# Patient Record
Sex: Male | Born: 1995 | Race: Black or African American | Hispanic: No | Marital: Single | Smoking: Current some day smoker
Health system: Southern US, Community
[De-identification: ages and names within clinical notes are randomized; demographics above are authoritative.]

## PROBLEM LIST (undated history)

## (undated) DIAGNOSIS — J45909 Unspecified asthma, uncomplicated: Secondary | ICD-10-CM

---

## 2015-09-24 ENCOUNTER — Emergency Department (HOSPITAL_COMMUNITY)
Admission: EM | Admit: 2015-09-24 | Discharge: 2015-09-24 | Disposition: A | Discharge status: Home / Self Care | Payer: Self-pay | Attending: Emergency Medicine | Admitting: Emergency Medicine

## 2015-09-24 ENCOUNTER — Encounter (HOSPITAL_COMMUNITY): Payer: Self-pay | Admitting: Emergency Medicine

## 2015-09-24 DIAGNOSIS — F1290 Cannabis use, unspecified, uncomplicated: Secondary | ICD-10-CM | POA: Insufficient documentation

## 2015-09-24 DIAGNOSIS — R55 Syncope and collapse: Secondary | ICD-10-CM | POA: Insufficient documentation

## 2015-09-24 LAB — MAGNESIUM: Magnesium: 1.8 mg/dL (ref 1.7–2.4)

## 2015-09-24 LAB — BASIC METABOLIC PANEL
ANION GAP: 5 (ref 5–15)
BUN: 19 mg/dL (ref 6–20)
CALCIUM: 9.4 mg/dL (ref 8.9–10.3)
CO2: 26 mmol/L (ref 22–32)
CREATININE: 1.23 mg/dL (ref 0.61–1.24)
Chloride: 107 mmol/L (ref 101–111)
GFR calc non Af Amer: >60 mL/min (ref >60)
Glucose, Bld: 99 mg/dL (ref 65–99)
POTASSIUM: 4 mmol/L (ref 3.5–5.1)
Sodium: 138 mmol/L (ref 135–145)

## 2015-09-24 LAB — CBC
HCT: 42.1 % (ref 39.0–52.0)
Hemoglobin: 14.3 g/dL (ref 13.0–17.0)
MCH: 29.9 pg (ref 26.0–34.0)
MCHC: 34 g/dL (ref 30.0–36.0)
MCV: 87.9 fL (ref 78.0–100.0)
PLATELETS: 236 10*3/uL (ref 150–400)
RBC: 4.79 MIL/uL (ref 4.22–5.81)
RDW: 13 % (ref 11.5–15.5)
WBC: 6.1 10*3/uL (ref 4.0–10.5)

## 2015-09-24 NOTE — ED Notes (Signed)
Writer went to check CBG, redo EKG and do othostatic vitals, but pt not in triage room.  Restroom and Xray checked to see if pt was there, she was not.  RN notified.

## 2015-09-24 NOTE — ED Triage Notes (Signed)
Patient A&O x4. Denies nausea, diaphoresis, CP, SOB. 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

## 2015-09-24 NOTE — ED Triage Notes (Signed)
Patient presents from work via ems for syncopal episode. Patient was at work emptying trash, became hot, became nauseated and diaphoretic and felt he was going to pass out. No syncopal episode.   Last VS: 118/83, 95hr, 17resp, 100% ra, cbg151, NSR on 12 lead in route.

## 2015-09-24 NOTE — ED Notes (Signed)
Dr. Rhunette CroftNanavati made aware patient appears to have left AMA.

## 2015-09-24 NOTE — ED Provider Notes (Signed)
[facility]-EMERGENCY DEPT Provider Note   CSN: [id number] Arrival date & time: 09/24/15  1506     History   Chief Complaint Chief Complaint  Patient presents with  . Near Syncope    HPI Noah Hanson is a 20 y.o. male.  HPI  Pt comes in with cc of near fainting. He has no medical hx. Uses marijuana occasionally. PT denies any family hx of premature deaths or cardiac disease. PT reports that he had a near syncope like episode prior to ER arrival. Pt was at work, and while he was working outside he started feeling hot, dizzy and felt like he might faint. Pt went to the cold room, and when he got out he had similar episode, so he came to the ER. PT reports that he had syncope - once in June and once in July. In June he was playing video game and had in July he was working outside.  History reviewed. No pertinent past medical history.  There are no active problems to display for this patient.   History reviewed. No pertinent surgical history.     Home Medications    Prior to Admission medications   Not on File    Family History No family history on file.  Social History Social History  Substance Use Topics  . Smoking status: Never Smoker  . Smokeless tobacco: Never Used  . Alcohol use Yes     Allergies   Review of patient's allergies indicates no known allergies.   Review of Systems Review of Systems  ROS 10 Systems reviewed and are negative for acute change except as noted in the HPI.   Physical Exam Updated Vital Signs BP 110/66 (BP Location: Right Arm)   Pulse 92   Temp 98.3 F (36.8 C) (Oral)   Resp 18   Ht 5\' 7"  (1.702 m)   Wt 130 lb (59 kg)   SpO2 99%   BMI 20.36 kg/m   Physical Exam  Constitutional: He is oriented to person, place, and time. He appears well-developed.  HENT:  Head: Normocephalic and atraumatic.  Eyes: Conjunctivae and EOM are normal. Pupils are equal, round, and reactive to light.  Neck: Normal range of motion. Neck  supple.  Cardiovascular: Normal rate, regular rhythm, normal heart sounds and intact distal pulses.   Pulmonary/Chest: Effort normal and breath sounds normal. No respiratory distress. He has no wheezes.  Abdominal: Soft. Bowel sounds are normal. He exhibits no distension. There is no tenderness. There is no rebound and no guarding.  Neurological: He is alert and oriented to person, place, and time.  Skin: Skin is warm.  Nursing note and vitals reviewed.    ED Treatments / Results  Labs (all labs ordered are listed, but only abnormal results are displayed) Labs Reviewed  BASIC METABOLIC PANEL  CBC  MAGNESIUM    EKG  EKG Interpretation  Date/Time:  Wednesday September 24 2015 15:20:34 EDT Ventricular Rate:  90 PR Interval:    QRS Duration: 71 QT Interval:  319 QTC Calculation: 391 R Axis:   87 Text Interpretation:  Sinus rhythm ST elevation, consider anterolateral injury St elevation inferior leads Likely J point elevation No old tracing to compare Confirmed by Rhunette CroftNANAVATI, MD, Janey GentaANKIT [phone]([id number]) on 09/24/2015 3:36:17 PM       Radiology No results found.  Procedures Procedures (including critical care time)  Medications Ordered in ED Medications - No data to display   Initial Impression / Assessment and Plan / ED Course  I have  reviewed the triage vital signs and the nursing notes.  Pertinent labs & imaging results that were available during my care of the patient were reviewed by me and considered in my medical decision making (see chart for details).  Clinical Course  Comment By Time  LATE ENTRY: EKG has diffuse ST elevation. No chest pain, dib. No signs of brugada. Intervals are normal. I spoke with Cardiology, Dr. [place]. He is aware of the presentation, prior syncope and no chest pain with the ekg. He clears the patient from Cards perspective - as he too thinks the EKG changes are likely early repo in a young male. Repeat ekg ordered. Pt informed of the discussion and we  ordered some basic labs. However, when the tech went for repeat labs, pt had left. His labs are reassuring. Derwood Kaplan, MD 08/30 2333      Final Clinical Impressions(s) / ED Diagnoses   Final diagnoses:  Near syncope   DDx includes:  Dysrhythmia PE Vasovagal/neurocardiogenic syncope Valvular disorder/Cardiomyopathy Anemia Pericarditis   New Prescriptions There are no discharge medications for this patient.    Derwood Kaplan, MD 09/24/15 [phone]

## 2017-09-22 ENCOUNTER — Emergency Department (HOSPITAL_COMMUNITY)
Admission: EM | Admit: 2017-09-22 | Discharge: 2017-09-22 | Disposition: A | Discharge status: Home / Self Care | Payer: Self-pay | Attending: Emergency Medicine | Admitting: Emergency Medicine

## 2017-09-22 ENCOUNTER — Emergency Department (HOSPITAL_COMMUNITY): Payer: Self-pay

## 2017-09-22 ENCOUNTER — Other Ambulatory Visit: Payer: Self-pay

## 2017-09-22 ENCOUNTER — Encounter (HOSPITAL_COMMUNITY): Payer: Self-pay

## 2017-09-22 DIAGNOSIS — Y999 Unspecified external cause status: Secondary | ICD-10-CM | POA: Insufficient documentation

## 2017-09-22 DIAGNOSIS — T148XXA Other injury of unspecified body region, initial encounter: Secondary | ICD-10-CM

## 2017-09-22 DIAGNOSIS — F1290 Cannabis use, unspecified, uncomplicated: Secondary | ICD-10-CM | POA: Insufficient documentation

## 2017-09-22 DIAGNOSIS — Z23 Encounter for immunization: Secondary | ICD-10-CM | POA: Insufficient documentation

## 2017-09-22 DIAGNOSIS — S91331A Puncture wound without foreign body, right foot, initial encounter: Secondary | ICD-10-CM | POA: Insufficient documentation

## 2017-09-22 DIAGNOSIS — Y939 Activity, unspecified: Secondary | ICD-10-CM | POA: Insufficient documentation

## 2017-09-22 DIAGNOSIS — Y929 Unspecified place or not applicable: Secondary | ICD-10-CM | POA: Insufficient documentation

## 2017-09-22 DIAGNOSIS — W458XXA Other foreign body or object entering through skin, initial encounter: Secondary | ICD-10-CM | POA: Insufficient documentation

## 2017-09-22 HISTORY — DX: Unspecified asthma, uncomplicated: J45.909

## 2017-09-22 MED ORDER — LIDOCAINE-EPINEPHRINE-TETRACAINE (LET) SOLUTION
3.0000 mL | Freq: Once | NASAL | Status: AC
  Administered 2017-09-22: 3 mL via TOPICAL
  Filled 2017-09-22: qty 3

## 2017-09-22 MED ORDER — TETANUS-DIPHTH-ACELL PERTUSSIS 5-2.5-18.5 LF-MCG/0.5 IM SUSP
0.5000 mL | Freq: Once | INTRAMUSCULAR | Status: AC
  Administered 2017-09-22: 0.5 mL via INTRAMUSCULAR
  Filled 2017-09-22: qty 0.5

## 2017-09-22 MED ORDER — BACITRACIN ZINC 500 UNIT/GM EX OINT
TOPICAL_OINTMENT | CUTANEOUS | Status: AC
  Administered 2017-09-22: 1
  Filled 2017-09-22: qty 2.7

## 2017-09-22 NOTE — ED Provider Notes (Signed)
[facility]-EMERGENCY DEPT Provider Note   CSN: [id number] Arrival date & time: 09/22/17  0030     History   Chief Complaint Chief Complaint  Patient presents with  . Foot Injury    HPI Noah Hanson is a 22 y.o. male.  Patient presents for wound on his right foot caused by stepping on a floor fan 2 days ago. The plastic fan broke and he is concerned there is a foreign body in his foot because his pain is still causing a problem with being ambulatory and working. No drainage. No significant swelling. He reports the wound still bleeds intermittently.  The history is provided by the patient. No language interpreter was used.    Past Medical History:  Diagnosis Date  . Asthma     There are no active problems to display for this patient.   History reviewed. No pertinent surgical history.      Home Medications    Prior to Admission medications   Not on File    Family History History reviewed. No pertinent family history.  Social History Social History   Tobacco Use  . Smoking status: Never Smoker  . Smokeless tobacco: Never Used  Substance Use Topics  . Alcohol use: Yes  . Drug use: Yes    Types: Marijuana     Allergies   Patient has no known allergies.   Review of Systems Review of Systems  Constitutional: Negative for fever.  Musculoskeletal:       See HPI.  Skin: Positive for wound.  Neurological: Negative for numbness.     Physical Exam Updated Vital Signs BP 128/83 (BP Location: Left Arm)   Pulse 89   Temp 97.9 F (36.6 C) (Oral)   Resp 16   Ht 5\' 8"  (1.727 m)   Wt 61.2 kg   SpO2 100%   BMI 20.53 kg/m   Physical Exam  Constitutional: He is oriented to person, place, and time. He appears well-developed and well-nourished.  Neck: Normal range of motion.  Pulmonary/Chest: Effort normal.  Musculoskeletal: Normal range of motion.  Right foot has a deep abrasion to plantar aspect at the arch at midfoot. There  is a full thickness puncture at the center of the abrasion. No FB visualized or palpated. No evidence secondary infection.   Neurological: He is alert and oriented to person, place, and time.  Skin: Skin is warm and dry.  Psychiatric: He has a normal mood and affect.     ED Treatments / Results  Labs (all labs ordered are listed, but only abnormal results are displayed) Labs Reviewed - No data to display  EKG None  Radiology No results found.  Procedures Procedures (including critical care time)  Medications Ordered in ED Medications  lidocaine-EPINEPHrine-tetracaine (LET) solution (has no administration in time range)  Tdap (BOOSTRIX) injection 0.5 mL (has no administration in time range)     Initial Impression / Assessment and Plan / ED Course  I have reviewed the triage vital signs and the nursing notes.  Pertinent labs & imaging results that were available during my care of the patient were reviewed by me and considered in my medical decision making (see chart for details).     Patient here after stepping on a plastic fan and being concerned for retained foreign body in the wound due to persistent pain.   Imaging is negative for FB. Tetanus updated. Wound explored and based of puncture is visualized well. No FB. Wound clean and bandaged with  Bacitracin.   Final Clinical Impressions(s) / ED Diagnoses   Final diagnoses:  None   1. Puncture wound right foot  ED Discharge Orders    None       Elpidio Anis, PA-C 09/22/17 0413    Gilda Crease, MD 09/22/17 [phone]

## 2017-09-22 NOTE — Discharge Instructions (Addendum)
Wash twice daily with warm soapy water and keep clean and covered. Apply topical antibiotic (over-the-counter Neopsporin or Polysporin).

## 2017-09-22 NOTE — ED Triage Notes (Signed)
Pt presents to ED from home for R foot injury. Pt reports that he stepped on a broken fan on Tuesday and thinks something is still in the foot.

## 2018-06-04 ENCOUNTER — Emergency Department (HOSPITAL_COMMUNITY): Payer: Self-pay

## 2018-06-04 ENCOUNTER — Other Ambulatory Visit: Payer: Self-pay

## 2018-06-04 ENCOUNTER — Emergency Department (HOSPITAL_COMMUNITY)
Admission: EM | Admit: 2018-06-04 | Discharge: 2018-06-04 | Disposition: A | Discharge status: Home / Self Care | Payer: Self-pay | Attending: Emergency Medicine | Admitting: Emergency Medicine

## 2018-06-04 ENCOUNTER — Encounter (HOSPITAL_COMMUNITY): Payer: Self-pay

## 2018-06-04 DIAGNOSIS — M25571 Pain in right ankle and joints of right foot: Secondary | ICD-10-CM

## 2018-06-04 DIAGNOSIS — S99921A Unspecified injury of right foot, initial encounter: Secondary | ICD-10-CM

## 2018-06-04 DIAGNOSIS — J45909 Unspecified asthma, uncomplicated: Secondary | ICD-10-CM | POA: Insufficient documentation

## 2018-06-04 NOTE — ED Triage Notes (Signed)
Pt states he has a hx of right ankle problems. Pt states that he is concerned he twisted it when playing basketball last night.

## 2018-06-04 NOTE — ED Provider Notes (Signed)
[facility]-EMERGENCY DEPT Provider Note   CSN: [id number] Arrival date & time: 06/04/18  1036    History   Chief Complaint Chief Complaint  Patient presents with  . Ankle Pain    right    HPI Noah Hanson is a 23 y.o. male.     HPI   Presents for evaluation of pain in right ankle after "rolling over on it," while playing basketball last night.  No other injuries.  He is using crutches and an ankle brace that he had at home already from a prior injury.  He does not take any medication for this.  He is currently employed.  He denies recent fever, chills, nausea, vomiting, cough, shortness of breath or chest pain.  There are no other known modifying factors.  Past Medical History:  Diagnosis Date  . Asthma     There are no active problems to display for this patient.   History reviewed. No pertinent surgical history.      Home Medications    Prior to Admission medications   Not on File    Family History No family history on file.  Social History Social History   Tobacco Use  . Smoking status: Never Smoker  . Smokeless tobacco: Never Used  Substance Use Topics  . Alcohol use: Yes  . Drug use: Yes    Types: Marijuana     Allergies   Patient has no known allergies.   Review of Systems Review of Systems  All other systems reviewed and are negative.    Physical Exam Updated Vital Signs BP 123/77 (BP Location: Left Arm)   Pulse 75   Temp 98 F (36.7 C) (Oral)   Resp 16   Ht 5\' 7"  (1.702 m)   Wt 59 kg   SpO2 98%   BMI 20.36 kg/m   Physical Exam Vitals signs and nursing note reviewed.  Constitutional:      General: He is not in acute distress.    Appearance: Normal appearance. He is well-developed. He is not ill-appearing, toxic-appearing or diaphoretic.  HENT:     Head: Normocephalic and atraumatic.     Right Ear: External ear normal.     Left Ear: External ear normal.  Eyes:     Conjunctiva/sclera:  Conjunctivae normal.     Pupils: Pupils are equal, round, and reactive to light.  Neck:     Musculoskeletal: Normal range of motion and neck supple.     Trachea: Phonation normal.  Cardiovascular:     Rate and Rhythm: Normal rate.  Pulmonary:     Effort: Pulmonary effort is normal.  Musculoskeletal:     Comments: He guards against movement of the right ankle secondary to pain.  The right ankle/foot is tender primarily over the lateral midfoot, without associated swelling or deformity.  There is no instability of the right ankle or foot.  Skin:    General: Skin is warm and dry.  Neurological:     Mental Status: He is alert and oriented to person, place, and time.     Cranial Nerves: No cranial nerve deficit.     Sensory: No sensory deficit.     Motor: No abnormal muscle tone.     Coordination: Coordination normal.  Psychiatric:        Mood and Affect: Mood normal.        Behavior: Behavior normal.        Thought Content: Thought content normal.  Judgment: Judgment normal.      ED Treatments / Results  Labs (all labs ordered are listed, but only abnormal results are displayed) Labs Reviewed - No data to display  EKG None  Radiology Dg Ankle Complete Right  Result Date: 06/04/2018 CLINICAL DATA:  Medial ankle pain, injury, difficulty bearing weight EXAM: RIGHT ANKLE - COMPLETE 3+ VIEW COMPARISON:  09/22/2017 FINDINGS: There is no evidence of fracture, dislocation, or joint effusion. There is no evidence of arthropathy or other focal bone abnormality. Soft tissues are unremarkable. IMPRESSION: No acute osseous finding or malalignment. Electronically Signed   By: Judie PetitM.  Shick M.D.   On: 06/04/2018 11:33    Procedures Procedures (including critical care time)  Medications Ordered in ED Medications - No data to display   Initial Impression / Assessment and Plan / ED Course  I have reviewed the triage vital signs and the nursing notes.  Pertinent labs & imaging results  that were available during my care of the patient were reviewed by me and considered in my medical decision making (see chart for details).         Patient Vitals for the past 24 hrs:  BP Temp Temp src Pulse Resp SpO2 Height Weight  06/04/18 1134 123/77 - - 75 16 98 % - -  06/04/18 1046 (!) 157/88 98 F (36.7 C) Oral 95 14 98 % - -  06/04/18 1045 - - - - - - 5\' 7"  (1.702 m) 59 kg    12:08 PM Reevaluation with update and discussion. After initial assessment and treatment, an updated evaluation reveals no change in clinical status.  Findings discussed with patient all questions were answered. Mancel BaleElliott Celia Friedland   Medical Decision Making: Right ankle injury, with pain both in ankle and foot.  No gross instability.  Possible fracture at the talonavicular bone.  This would be a small avulsion fracture, and not require acute intervention.  He is stable for outpatient management with follow-up with orthopedics, in a week or so.  CRITICAL CARE-no Performed by: Mancel BaleElliott Lam Bjorklund  Nursing Notes Reviewed/ Care Coordinated Applicable Imaging Reviewed Interpretation of Laboratory Data incorporated into ED treatment  The patient appears reasonably screened and/or stabilized for discharge and I doubt any other medical condition or other [facility] requiring further screening, evaluation, or treatment in the ED at this time prior to discharge.  Plan: Home Medications-ibuprofen PRN; Home Treatments-Rice; return here if the recommended treatment, does not improve the symptoms; Recommended follow up-Ortho 1 week   Final Clinical Impressions(s) / ED Diagnoses   Final diagnoses:  Acute right ankle pain  Injury of right foot, initial encounter    ED Discharge Orders    None       Mancel BaleWentz, Sanya Kobrin, MD 06/04/18 1210

## 2018-06-04 NOTE — Discharge Instructions (Addendum)
The x-ray indicated a possible small fracture to the talar navicular bone, of your foot.  This is very near to your ankle, and may or may not be an actual fracture.  This injury can be treated as a sprain, with ice, elevation, and ibuprofen.  Use the splint that you have, and crutches as needed for comfort when walking.  You can bear weight as tolerated.  Since there is a possible fracture it will be helpful to follow-up with an orthopedist, for further evaluation and treatment in a week or so.

## 2019-01-01 ENCOUNTER — Other Ambulatory Visit: Payer: Self-pay

## 2019-01-01 DIAGNOSIS — Z20822 Contact with and (suspected) exposure to covid-19: Secondary | ICD-10-CM

## 2019-01-03 ENCOUNTER — Telehealth: Payer: Self-pay | Admitting: Critical Care Medicine

## 2019-01-03 ENCOUNTER — Telehealth: Payer: Self-pay | Admitting: General Practice

## 2019-01-03 LAB — NOVEL CORONAVIRUS, NAA: SARS-CoV-2, NAA: DETECTED — AB

## 2019-01-03 NOTE — Telephone Encounter (Signed)
Pt called about +COVID test; he has loss of smell; tier of symptoms reviewed; instructions given to pt: " remain in self-quarantine until they meet the "Non-Test Criteria for Ending Self-Isolation". Non-Test Criteria for Ending Self-Isolation All persons with fever and respiratory symptoms should isolate themselves until ALL conditions listed below are met: " at least 10 days since symptoms onset " AND 3 consecutive days fever free without antipyretics (acetaminophen [Tylenol] or ibuprofen [Advil]) " AND improvement in respiratory symptoms " If the patient develops respiratory issues/distress, seek medical care in the Emergency Department, call 911, reports symptoms and report COVID-19 positive test. Patient Instructions " patient to continue to utilize over-the-counter medications for fever (ibuprofen and/or Tylenol) and cough (cough medicine and/or sore throat lozenges). " patient to wear a mask around people and follow good infection prevention techniques. " only leave home to seek medical care. " send family for food, prescriptions or medicines; or use delivery service.  " If the patient must leave the home, they MUST wear a mask in public. " limit contact with immediate family members or caregivers in the home, and use mask, social distancing, and handwashing to decrease risk to patients. o Please continue good preventive care measures, including frequent hand washing, avoid touching your face, cover coughs/sneezes with tissue or into elbow, stay out of crowds and keep a 6-foot distance from others.   " patient and family to clean hard surfaces touched by patient frequently with household cleaning products; pt advised to notify his PCP; pt also informed the [facility] Dept was notified; he verbalized understanding. Unable to chart in result note because no encounter created.

## 2019-01-03 NOTE — Telephone Encounter (Signed)
Pt called and received positive covid results and would like a call back. Pt states that he has a 13mo old and is very worried. Please advise

## 2019-01-03 NOTE — Telephone Encounter (Signed)
Attempted to call pt.  Voice mailbox full; no able to leave message at this time.

## 2019-01-03 NOTE — Telephone Encounter (Signed)
The patient is aware of his result has mild taste and smell sensation changes only he has been ill since 6 December.  I told him he needs to stay in isolation till 17 December.  He is not a monoclonal antibody candidate

## 2019-01-03 NOTE — Telephone Encounter (Signed)
-----   Message from Dimple Nanas, RN sent at 01/03/2019 10:10 AM EST ----- Regarding: Positive Results Positive Results ----- Message ----- From: Interface, [facility] Lab Results In Sent: 01/03/2019   6:36 AM EST To: Mobile Screening Testing Result Pool

## 2019-01-08 ENCOUNTER — Ambulatory Visit: Payer: Self-pay

## 2019-01-08 NOTE — Telephone Encounter (Signed)
Provided care advice advice , reviewed Isolation  Protocol  Voiced understanding.  Pt.  Requested a copy of results be mailed to him .  Will make request

## 2019-01-12 ENCOUNTER — Other Ambulatory Visit: Payer: Self-pay

## 2019-03-22 ENCOUNTER — Encounter (HOSPITAL_COMMUNITY): Payer: Self-pay | Admitting: Emergency Medicine

## 2019-03-22 ENCOUNTER — Emergency Department (HOSPITAL_COMMUNITY)
Admission: EM | Admit: 2019-03-22 | Discharge: 2019-03-22 | Disposition: A | Discharge status: Home / Self Care | Payer: Self-pay | Attending: Emergency Medicine | Admitting: Emergency Medicine

## 2019-03-22 ENCOUNTER — Other Ambulatory Visit: Payer: Self-pay

## 2019-03-22 DIAGNOSIS — L509 Urticaria, unspecified: Secondary | ICD-10-CM | POA: Insufficient documentation

## 2019-03-22 DIAGNOSIS — J45909 Unspecified asthma, uncomplicated: Secondary | ICD-10-CM | POA: Insufficient documentation

## 2019-03-22 MED ORDER — DIPHENHYDRAMINE HCL 25 MG PO TABS: 25.0000 mg | ORAL_TABLET | Freq: Four times a day (QID) | ORAL | 0 refills | Status: DC | PRN

## 2019-03-22 MED ORDER — DIPHENHYDRAMINE HCL 50 MG/ML IJ SOLN
50.0000 mg | Freq: Once | INTRAMUSCULAR | Status: AC
  Administered 2019-03-22: 20:00:00 50 mg via INTRAVENOUS
  Filled 2019-03-22: qty 1

## 2019-03-22 MED ORDER — METHYLPREDNISOLONE SODIUM SUCC 125 MG IJ SOLR
125.0000 mg | Freq: Once | INTRAMUSCULAR | Status: AC
  Administered 2019-03-22: 20:00:00 125 mg via INTRAVENOUS
  Filled 2019-03-22: qty 2

## 2019-03-22 MED ORDER — PREDNISONE 20 MG PO TABS: ORAL_TABLET | ORAL | 0 refills | Status: DC

## 2019-03-22 MED ORDER — SODIUM CHLORIDE 0.9 % IV BOLUS
1000.0000 mL | Freq: Once | INTRAVENOUS | Status: AC
  Administered 2019-03-22: 20:00:00 1000 mL via INTRAVENOUS

## 2019-03-22 MED ORDER — FAMOTIDINE IN NACL 20-0.9 MG/50ML-% IV SOLN
20.0000 mg | Freq: Once | INTRAVENOUS | Status: AC
  Administered 2019-03-22: 20 mg via INTRAVENOUS
  Filled 2019-03-22: qty 50

## 2019-03-22 NOTE — ED Notes (Signed)
Patient left prior to receiving prescriptions and discharge instructions. Patient last seen in NAD and ambulatory without difficulty.

## 2019-03-22 NOTE — ED Notes (Signed)
Patient not in room at time of attempted discharge.

## 2019-03-22 NOTE — ED Provider Notes (Signed)
[facility]-EMERGENCY DEPT Provider Note   CSN: [id number] Arrival date & time: 03/22/19  1836     History Chief Complaint  Patient presents with  . Urticaria    Noah Hanson is a 24 y.o. male.  The history is provided by the patient. No language interpreter was used.     24 year old male presenting complaining of itchy hives.  Patient developed itchy hives approximately 1 hour ago.  It started around his face and now spreading to his arms legs.  He denies any sensation of lightheadedness or dizziness no throat swelling tongue swelling trouble breathing wheezing abdominal cramping.  He is unaware of any inciting factor.  He admits to eating crab legs and corn for lunch but states that this not unusual for him as he ate seafood on a regular basis.  He also report playing basketball outside and may have been exposed to poison oak poison ivy but do not specifically recall any true exposure.  He denies any change in his soap, lotion, medication, or any other environmental changes.  He denies any prior allergic symptoms.  No specific treatment tried.  Symptoms moderate in severity.    Past Medical History:  Diagnosis Date  . Asthma     There are no problems to display for this patient.   History reviewed. No pertinent surgical history.     No family history on file.  Social History   Tobacco Use  . Smoking status: Never Smoker  . Smokeless tobacco: Never Used  Substance Use Topics  . Alcohol use: Yes  . Drug use: Yes    Types: Marijuana    Home Medications Prior to Admission medications   Not on File    Allergies    Patient has no known allergies.  Review of Systems   Review of Systems  All other systems reviewed and are negative.   Physical Exam Updated Vital Signs BP 108/90   Pulse (!) 110   Temp 98.2 F (36.8 C)   Resp 16   SpO2 99%   Physical Exam Vitals and nursing note reviewed.  Constitutional:      General: He is not  in acute distress.    Appearance: He is well-developed.  HENT:     Head: Atraumatic.     Mouth/Throat:     Comments: No angioedema no tongue swelling, no drooling, normal phonation. Eyes:     Conjunctiva/sclera: Conjunctivae normal.  Cardiovascular:     Rate and Rhythm: Tachycardia present.     Pulses: Normal pulses.     Heart sounds: Normal heart sounds.  Pulmonary:     Effort: Pulmonary effort is normal.     Breath sounds: Normal breath sounds. No wheezing.  Abdominal:     Palpations: Abdomen is soft.     Tenderness: There is no abdominal tenderness.  Musculoskeletal:     Cervical back: Neck supple.  Skin:    Findings: No rash (Urticaria noted throughout the face, bilateral arms, and bilateral thigh).  Neurological:     Mental Status: He is alert and oriented to person, place, and time.  Psychiatric:        Mood and Affect: Mood normal.     ED Results / Procedures / Treatments   Labs (all labs ordered are listed, but only abnormal results are displayed) Labs Reviewed - No data to display  EKG None  Radiology No results found.  Procedures Procedures (including critical care time)  Medications Ordered in ED Medications  methylPREDNISolone  sodium succinate (SOLU-MEDROL) 125 mg/2 mL injection 125 mg (125 mg Intravenous Given 03/22/19 1947)  diphenhydrAMINE (BENADRYL) injection 50 mg (50 mg Intravenous Given 03/22/19 1947)  famotidine (PEPCID) IVPB 20 mg premix (0 mg Intravenous Stopped 03/22/19 2101)  sodium chloride 0.9 % bolus 1,000 mL (0 mLs Intravenous Stopped 03/22/19 2115)    ED Course  I have reviewed the triage vital signs and the nursing notes.  Pertinent labs & imaging results that were available during my care of the patient were reviewed by me and considered in my medical decision making (see chart for details).    MDM Rules/Calculators/A&P                      BP 114/77   Pulse 65   Temp 98.2 F (36.8 C)   Resp 17   SpO2 100%   Final Clinical  Impression(s) / ED Diagnoses Final diagnoses:  Hives    Rx / DC Orders ED Discharge Orders    None     7:27 PM Patient here with urticarial rash started approximately 1 hour ago.  Unknown inciting factor.  At this time no obvious evidence of anaphylaxis however he is tachycardic and blood pressure is a bit on the softer side at 108/90.  Will give IV fluid, Solu-Medrol, Benadryl, Pepcid and will monitor closely.  10:05 PM Patient has been monitored for the past 3 hours with steady improvement of his symptoms.  At this time he felt comfortable going home.  Patient discharged home with prednisone and Benadryl.  Recommend outpatient follow-up with allergist for evaluation.  Return precaution discussed.   Domenic Moras, PA-C 03/22/19 2205    Lucrezia Starch, MD 03/23/19 [phone]

## 2019-03-22 NOTE — Discharge Instructions (Signed)
You have been evaluated for your hives.  Take medication prescribed.  Follow-up with an allergist for further evaluation of your condition.  Return to the ER if your condition worsen, if you have trouble breathing, tongue swelling, abdominal cramping, or if you have any other concern.

## 2019-03-22 NOTE — ED Triage Notes (Signed)
Pt reports that about 15 minutes or so ago after playing basketball started itching. Pt unsure what has caused it.

## 2019-05-02 ENCOUNTER — Ambulatory Visit: Payer: Medicaid Other | Attending: Internal Medicine

## 2019-05-02 DIAGNOSIS — Z20822 Contact with and (suspected) exposure to covid-19: Secondary | ICD-10-CM

## 2019-05-03 ENCOUNTER — Telehealth: Payer: Self-pay | Admitting: General Practice

## 2019-05-03 LAB — SARS-COV-2, NAA 2 DAY TAT

## 2019-05-03 LAB — NOVEL CORONAVIRUS, NAA: SARS-CoV-2, NAA: NOT DETECTED

## 2019-05-03 NOTE — Telephone Encounter (Signed)
Negative COVID results given. Patient results "NOT Detected." Caller expressed understanding. ° °

## 2019-06-12 ENCOUNTER — Other Ambulatory Visit: Payer: Self-pay

## 2019-06-12 ENCOUNTER — Emergency Department (HOSPITAL_COMMUNITY)
Admission: EM | Admit: 2019-06-12 | Discharge: 2019-06-12 | Disposition: A | Discharge status: Home / Self Care | Payer: Medicaid Other | Attending: Emergency Medicine | Admitting: Emergency Medicine

## 2019-06-12 ENCOUNTER — Encounter (HOSPITAL_COMMUNITY): Payer: Self-pay

## 2019-06-12 DIAGNOSIS — R1110 Vomiting, unspecified: Secondary | ICD-10-CM | POA: Insufficient documentation

## 2019-06-12 DIAGNOSIS — Z5321 Procedure and treatment not carried out due to patient leaving prior to being seen by health care provider: Secondary | ICD-10-CM | POA: Insufficient documentation

## 2019-06-12 DIAGNOSIS — R109 Unspecified abdominal pain: Secondary | ICD-10-CM | POA: Insufficient documentation

## 2019-06-12 MED ORDER — SODIUM CHLORIDE 0.9% FLUSH: 3.0000 mL | Freq: Once | INTRAVENOUS | Status: DC

## 2019-06-12 NOTE — ED Triage Notes (Signed)
patient c/o abdominal pain that feels like gnawing and emesis since 1200 today.

## 2019-06-12 NOTE — ED Notes (Signed)
Pt called for rooming x2. No answer 

## 2019-06-12 NOTE — ED Notes (Signed)
Pt called for rooming x3! No answer! 

## 2019-06-12 NOTE — ED Notes (Signed)
Pt called for rooming x1 with no answer.

## 2019-08-01 ENCOUNTER — Ambulatory Visit: Payer: Medicaid Other | Attending: Internal Medicine

## 2019-08-01 DIAGNOSIS — Z20822 Contact with and (suspected) exposure to covid-19: Secondary | ICD-10-CM

## 2019-08-02 LAB — NOVEL CORONAVIRUS, NAA: SARS-CoV-2, NAA: NOT DETECTED

## 2019-08-02 LAB — SARS-COV-2, NAA 2 DAY TAT

## 2020-01-03 ENCOUNTER — Encounter (HOSPITAL_COMMUNITY): Payer: Self-pay

## 2020-01-03 ENCOUNTER — Other Ambulatory Visit: Payer: Medicaid Other

## 2020-01-03 DIAGNOSIS — Z20822 Contact with and (suspected) exposure to covid-19: Secondary | ICD-10-CM

## 2020-01-04 LAB — SARS-COV-2, NAA 2 DAY TAT

## 2020-01-04 LAB — NOVEL CORONAVIRUS, NAA: SARS-CoV-2, NAA: NOT DETECTED

## 2020-02-06 ENCOUNTER — Other Ambulatory Visit: Payer: Medicaid Other

## 2020-02-08 ENCOUNTER — Other Ambulatory Visit: Payer: Medicaid Other

## 2020-02-16 ENCOUNTER — Ambulatory Visit (HOSPITAL_COMMUNITY)
Admission: EM | Admit: 2020-02-16 | Discharge: 2020-02-16 | Disposition: A | Discharge status: Home / Self Care | Payer: No Payment, Other | Attending: Psychiatry | Admitting: Psychiatry

## 2020-02-16 ENCOUNTER — Ambulatory Visit (HOSPITAL_COMMUNITY)
Admission: RE | Admit: 2020-02-16 | Discharge: 2020-02-16 | Disposition: A | Discharge status: Home / Self Care | Payer: Federal, State, Local not specified - Other | Attending: Psychiatry | Admitting: Psychiatry

## 2020-02-16 ENCOUNTER — Other Ambulatory Visit: Payer: Self-pay

## 2020-02-16 DIAGNOSIS — F329 Major depressive disorder, single episode, unspecified: Secondary | ICD-10-CM | POA: Diagnosis not present

## 2020-02-16 DIAGNOSIS — R45851 Suicidal ideations: Secondary | ICD-10-CM | POA: Insufficient documentation

## 2020-02-16 DIAGNOSIS — F1220 Cannabis dependence, uncomplicated: Secondary | ICD-10-CM | POA: Insufficient documentation

## 2020-02-16 DIAGNOSIS — Z20822 Contact with and (suspected) exposure to covid-19: Secondary | ICD-10-CM | POA: Insufficient documentation

## 2020-02-16 DIAGNOSIS — F419 Anxiety disorder, unspecified: Secondary | ICD-10-CM | POA: Insufficient documentation

## 2020-02-16 DIAGNOSIS — Z79899 Other long term (current) drug therapy: Secondary | ICD-10-CM | POA: Insufficient documentation

## 2020-02-16 DIAGNOSIS — F332 Major depressive disorder, recurrent severe without psychotic features: Secondary | ICD-10-CM | POA: Insufficient documentation

## 2020-02-16 DIAGNOSIS — Z7289 Other problems related to lifestyle: Secondary | ICD-10-CM | POA: Insufficient documentation

## 2020-02-16 DIAGNOSIS — F321 Major depressive disorder, single episode, moderate: Secondary | ICD-10-CM

## 2020-02-16 LAB — RESP PANEL BY RT-PCR (FLU A&B, COVID) ARPGX2
Influenza A by PCR: NEGATIVE
Influenza B by PCR: NEGATIVE
SARS Coronavirus 2 by RT PCR: NEGATIVE

## 2020-02-16 MED ORDER — HYDROXYZINE HCL 25 MG PO TABS: 25.0000 mg | ORAL_TABLET | Freq: Three times a day (TID) | ORAL | 0 refills | Status: DC | PRN

## 2020-02-16 MED ORDER — ALUM & MAG HYDROXIDE-SIMETH 200-200-20 MG/5ML PO SUSP: 30.0000 mL | ORAL | Status: DC | PRN

## 2020-02-16 MED ORDER — HYDROXYZINE HCL 25 MG PO TABS
25.0000 mg | ORAL_TABLET | ORAL | Status: DC | PRN
  Administered 2020-02-16: 25 mg via ORAL
  Filled 2020-02-16: qty 1

## 2020-02-16 MED ORDER — MAGNESIUM HYDROXIDE 400 MG/5ML PO SUSP: 30.0000 mL | Freq: Every day | ORAL | Status: DC | PRN

## 2020-02-16 MED ORDER — ACETAMINOPHEN 325 MG PO TABS: 650.0000 mg | ORAL_TABLET | Freq: Four times a day (QID) | ORAL | Status: DC | PRN

## 2020-02-16 NOTE — ED Notes (Signed)
Pt sleeping@this  tim. No c/o of pain or distress. Breathing even and unlabored. Will continue to monitor for safety

## 2020-02-16 NOTE — ED Notes (Signed)
Pt is requesting to leave the facility he said "I just cannot stay here". NP was notified of patient wanting to leave

## 2020-02-16 NOTE — ED Notes (Signed)
Pt resting in chair/bed watching television. No distress noted. Pt remains safe on unit.

## 2020-02-16 NOTE — H&P (Signed)
Behavioral Health Medical Screening Exam  Noah Hanson is a 25 y.o. male.  Who [facility] a [facility]-in.  Patient reports that he got into an argument with his girlfriend, felt overwhelmed, took a gun but stopped from doing anything as he has a daughter and wanted to be there for her.  Patient reports that he gave the gun to his girlfriend.  Patient reports that his girlfriend just dropped him off, did not stay, feels she does not care for him.  Patient that he is struggling on and off for depression, feeling overwhelmed, is not sure he can keep himself safe and feels he needs psychiatric help.  Patient reports that he does have social support which is his aunt and his mom and reports that his mom lives with his aunt.  He states that they live in [place] but as he is overwhelmed, at this point cannot contract for safety.  Patient states that he would do fine in the hospital, wants to be there for his daughter.  Total Time spent with patient: 30 minutes  Psychiatric Specialty Exam Psychiatric Specialty Exam: Physical Exam Constitutional:      Appearance: Normal appearance.  HENT:     Head: Normocephalic and atraumatic.     Mouth/Throat:     Mouth: Mucous membranes are moist.  Eyes:     Extraocular Movements: Extraocular movements intact.     Conjunctiva/sclera: Conjunctivae normal.     Pupils: Pupils are equal, round, and reactive to light.  Cardiovascular:     Rate and Rhythm: Normal rate and regular rhythm.     Pulses: Normal pulses.  Pulmonary:     Effort: Pulmonary effort is normal.     Breath sounds: Normal breath sounds.  Musculoskeletal:        General: Normal range of motion.     Cervical back: Normal range of motion and neck supple.  Skin:    General: Skin is warm and dry.  Neurological:     General: No focal deficit present.     Mental Status: He is alert and oriented to person, place, and time.     Review of Systems  Constitutional:  Negative for fever and malaise/fatigue.  HENT: Negative.  Negative for congestion, hearing loss and sore throat.   Eyes: Positive for redness. Negative for blurred vision.       Patient is tearful  Cardiovascular: Negative for chest pain and palpitations.  Gastrointestinal: Negative.  Negative for diarrhea, heartburn, nausea and vomiting.  Musculoskeletal: Negative for joint pain and myalgias.  Neurological: Negative for dizziness, tingling, seizures and weakness. Loss of consciousness: .mse.  Psychiatric/Behavioral: Positive for depression, substance abuse and suicidal ideas (.pse). The patient is not nervous/anxious and does not have insomnia.     Blood pressure 135/88, pulse 79, temperature 98.6 F (37 C), temperature source Oral, resp. rate 20, SpO2 100 %.There is no height or weight on file to calculate BMI.  General Appearance: Casual and tearful  Eye Contact:  Fair  Speech:  Clear and Coherent and Normal Rate  Volume:  Decreased  Mood:  Depressed, Dysphoric, Hopeless and Worthless  Affect:  Appropriate, Constricted and Tearful  Thought Process:  Coherent, Goal Directed and Descriptions of Associations: Intact  Orientation:  Full (Time, Place, and Person)  Thought Content:  Illogical and Rumination  Suicidal Thoughts:  Yes.  with intent/plan  Homicidal Thoughts:  No  Memory:  Immediate;   Fair Recent;   Fair Remote;   Fair  Judgement:  Impaired  Insight:  Shallow  Psychomotor Activity:  Normal  Concentration:  Concentration: Fair and Attention Span: Fair  Recall:  [employer] of Knowledge:  Fair  Language:  Fair  Akathisia:  No  Handed:  Right  AIMS (if indicated):     Assets:  Desire for Improvement Physical Health Social Support  ADL's:  Impaired  Cognition:  WNL  Sleep:       Physical Exam: Physical Exam Constitutional:      Appearance: Normal appearance.  HENT:     Head: Normocephalic and atraumatic.     Mouth/Throat:     Mouth: Mucous membranes are moist.   Eyes:     Extraocular Movements: Extraocular movements intact.     Conjunctiva/sclera: Conjunctivae normal.     Pupils: Pupils are equal, round, and reactive to light.  Cardiovascular:     Rate and Rhythm: Normal rate and regular rhythm.     Pulses: Normal pulses.  Pulmonary:     Effort: Pulmonary effort is normal.     Breath sounds: Normal breath sounds.  Musculoskeletal:        General: Normal range of motion.     Cervical back: Normal range of motion and neck supple.  Skin:    General: Skin is warm and dry.  Neurological:     General: No focal deficit present.     Mental Status: He is alert and oriented to person, place, and time.    Review of Systems  Constitutional: Negative for fever and malaise/fatigue.  HENT: Negative.  Negative for congestion, hearing loss and sore throat.   Eyes: Positive for redness. Negative for blurred vision.       Patient is tearful  Cardiovascular: Negative for chest pain and palpitations.  Gastrointestinal: Negative.  Negative for diarrhea, heartburn, nausea and vomiting.  Musculoskeletal: Negative for joint pain and myalgias.  Neurological: Negative for dizziness, tingling, seizures and weakness. Loss of consciousness: .mse.  Psychiatric/Behavioral: Positive for depression, substance abuse and suicidal ideas (.pse). The patient is not nervous/anxious and does not have insomnia.    There were no vitals taken for this visit. There is no height or weight on file to calculate BMI.  Musculoskeletal: Strength & Muscle Tone: within normal limits Gait & Station: normal Patient leans: N/A   Recommendations: Patient to be admitted for continuous assessment at [facility] behavioral health urgent care for crisis stabilization along with medication management. Based on my evaluation the patient does not appear to have an emergency medical condition.  Nelly Rout, MD 02/16/2020, 12:52 PM
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
## 2020-02-16 NOTE — ED Provider Notes (Signed)
Behavioral Health Admission H&P [facility] - Behavioral Health Services & OBS)  Date: 02/16/20 Patient Name: Noah Hanson MRN: [id number] Chief Complaint:  Chief Complaint  Patient presents with  . Suicidal      Diagnoses:  Final diagnoses:  None    Evaluation:  Noah Hanson was transferred from [facility] behavioral health patient is requesting to discharge.  Reported things just became overwhelming.  States he resides with his girlfriend and his girlfriend sister.  He reports he feels as if " I do everything for their family with no help."  Currently he is denying suicidal or homicidal ideations.  Reports girlfriend has access to the firearm.  Patient encouraged to continue with overnight observation will initiate hydroxyzine for anxiety/sedation.  Patient was receptive.  Staff to continue to monitor for safety.  Support,encouragement and  reassurance was provided.   HPI: Per admission assessment note: Noah Hanson is a 25 y.o. male.  Who presented to behavioral health Hospital as a walk-in.  Patient reports that he got into an argument with his girlfriend, felt overwhelmed, took a gun but stopped from doing anything as he has a daughter and wanted to be there for her.  Patient reports that he gave the gun to his girlfriend.  Patient reports that his girlfriend just dropped him off, did not stay, feels she does not care for him.  Patient that he is struggling on and off for depression, feeling overwhelmed, is not sure he can keep himself safe and feels he needs psychiatric help.  Patient reports that he does have social support which is his aunt and his mom and reports that his mom lives with his aunt.  He states that they live in [place] but as he is overwhelmed, at this point cannot contract for safety.  Patient states that he would do fine in the hospital, wants to be there for his daughter.  PHQ 2-9:  Flowsheet Row ED from 02/16/2020 in [facility]  Thoughts that you would be better off  dead, or of hurting yourself in some way More than half the days  [Phreesia 02/16/2020]  PHQ-9 Total Score 20      Flowsheet Row ED from 02/16/2020 in [facility]  C-SSRS RISK CATEGORY High Risk       Total Time spent with patient: 15 minutes  Musculoskeletal  Strength & Muscle Tone: within normal limits Gait & Station: normal Patient leans: N/A  Psychiatric Specialty Exam  Presentation General Appearance: Appropriate for Environment  Eye Contact:None  Speech:Clear and Coherent  Speech Volume:Normal  Handedness:Right   Mood and Affect  Mood:Anxious; Depressed  Affect:Congruent   Thought Process  Thought Processes:Coherent  Descriptions of Associations:Intact  Orientation:Full (Time, Place and Person)  Thought Content:Logical  Hallucinations:Hallucinations: None  Ideas of Reference:None  Suicidal Thoughts:Suicidal Thoughts: No  Homicidal Thoughts:Homicidal Thoughts: No   Sensorium  Memory:No data recorded Judgment:Fair  Insight:Fair   Executive Functions  Concentration:Fair  Attention Span:Fair  Recall:Good  Fund of Knowledge:Good  Language:Good   Psychomotor Activity  Psychomotor Activity:Psychomotor Activity: Normal   Assets  Assets:Communication Skills   Sleep  Sleep:Sleep: Fair   Physical Exam ROS  Blood pressure 120/80, pulse 73, temperature 98 F (36.7 C), temperature source Oral, resp. rate 18, height 5\' 8"  (1.727 m), weight 130 lb (59 kg), SpO2 100 %. Body mass index is 19.77 kg/m.  Past Psychiatric History: Denied  Is the patient at risk to self? Yes  Has the patient been a risk to self in the past  6 months? Yes .    Has the patient been a risk to self within the distant past? No   Is the patient a risk to others? No   Has the patient been a risk to others in the past 6 months? No   Has the patient been a risk to others within the distant past? No   Past Medical History:  Past  Medical History:  Diagnosis Date  . Asthma    No past surgical history on file.  Family History:  Family History  Problem Relation Age of Onset  . Healthy Mother     Social History:  Social History   Socioeconomic History  . Marital status: Single    Spouse name: Not on file  . Number of children: Not on file  . Years of education: Not on file  . Highest education level: Not on file  Occupational History  . Not on file  Tobacco Use  . Smoking status: Never Smoker  . Smokeless tobacco: Never Used  Vaping Use  . Vaping Use: Never used  Substance and Sexual Activity  . Alcohol use: Yes  . Drug use: Yes    Types: Marijuana  . Sexual activity: Not on file  Other Topics Concern  . Not on file  Social History Narrative   ** Merged History Encounter **       Social Determinants of Health   Financial Resource Strain: Not on file  Food Insecurity: Not on file  Transportation Needs: Not on file  Physical Activity: Not on file  Stress: Not on file  Social Connections: Not on file  Intimate Partner Violence: Not on file    SDOH:  SDOH Screenings   Alcohol Screen: Not on file  Depression (PHQ2-9): Medium Risk  . PHQ-2 Score: 20  Financial Resource Strain: Not on file  Food Insecurity: Not on file  Housing: Not on file  Physical Activity: Not on file  Social Connections: Not on file  Stress: Not on file  Tobacco Use: Low Risk   . Smoking Tobacco Use: Never Smoker  . Smokeless Tobacco Use: Never Used  Transportation Needs: Not on file    Last Labs:  Lab on 01/03/2020  Component Date Value Ref Range Status  . SARS-CoV-2, NAA 01/03/2020 Not Detected  Not Detected Final   Comment: This nucleic acid amplification test was developed and its performance characteristics determined by [employer]. Nucleic acid amplification tests include RT-PCR and TMA. This test has not been FDA cleared or approved. This test has been authorized by FDA under an Emergency  Use Authorization (EUA). This test is only authorized for the duration of time the declaration that circumstances exist justifying the authorization of the emergency use of in vitro diagnostic tests for detection of SARS-CoV-2 virus and/or diagnosis of COVID-19 infection under section 564(b)(1) of the Act, 21 U.S.C. 701XBL-3(J) (1), unless the authorization is terminated or revoked sooner. When diagnostic testing is negative, the possibility of a false negative result should be considered in the context of a patient's recent exposures and the presence of clinical signs and symptoms consistent with COVID-19. An individual without symptoms of COVID-19 and who is not shedding SARS-CoV-2 virus wo                          uld expect to have a negative (not detected) result in this assay.   Marland Kitchen SARS-CoV-2, NAA 2 DAY TAT 01/03/2020 Performed   Final  Allergies: Patient has no known allergies.  PTA Medications: (Not in a hospital admission)   Medical Decision Making  Overnight observation Initiated hydroxyzine 25 mg p.o. 3 times daily as needed -Declined antidepressant    Recommendations  Based on my evaluation the patient does not appear to have an emergency medical condition.  Oneta Rack, NP 02/16/20  3:42 PM

## 2020-02-16 NOTE — ED Notes (Signed)
Pt signed AMA for d/c

## 2020-02-16 NOTE — ED Notes (Signed)
25 yo male direct admit from [facility] due to suicidal gesture by walking down road with gun in his hand. Pt states, "my girlfriend and I were arguing and some things were said. So I just grabbed a gun and started walking down the road. I thought about shooting myself but I didn't. My girlfriend has the gun now. Sometimes I see shadows of dogs and things. I think I am bipolar". At current, pt denies SI/HI/AVH. Pt oriented to unit. Safety maintained.

## 2020-02-16 NOTE — ED Provider Notes (Signed)
FBC/OBS ASAP Discharge Summary  Date and Time: 02/16/2020 11:08 PM  Name: Noah Hanson  MRN:  [id number]   Discharge Diagnoses:  Final diagnoses:  Severe episode of recurrent major depressive disorder, without psychotic features (HCC)  Cannabis use disorder, moderate, dependence (HCC)    Subjective: Was notified by nursing staff that patient was requesting to leave and be discharged from the [facility].  Spoke with the patient regarding this.  Patient states that being in the [facility] observation area with nothing to do or no one to talk to is making him very angry.  Patient states that continuing to be at the behavioral health urgent care is not helping him and that he does not believe that he needs to be at the behavioral urgent care anymore at this point.  Patient denies SI, HI, AVH, paranoia, or delusions.  Patient states that he "loves life" and states that he could never think about or follow through with harming or killing himself because he needs to live for his girlfriend and 26-year-old child.  Patient states that his girlfriend sold her gun on 02/16/20 after the patient went to [facility] on 02/16/20 and reports that him and his girlfriend no longer have access to any guns or weapons.  He states that his appetite is good.  He reports that he is wanting to go home to be with his girlfriend and 7-year-old child.  Patient states that he would like to be see a psychiatrist and therapist as an outpatient.  Patient also states that he believes the Vistaril is helping with his anxiety and would like to continue the Vistaril.  With the patient's consent, collateral was obtained by speaking with the patient's girlfriend Dennison Nancy: [phone]) via phone.  Ms. Dimas Aguas reports that early on February 16, 2020, the patient "seeing fine".  She states that the patient told her on February 16, 2020 that he was going for a walk and she noticed that her gun was missing.  Ms. Dimas Aguas states that she called the patient and that  the patient was tearful and told her to pick him up.  Ms. Dimas Aguas says that she then brought the patient to [facility] to be evaluated.  Ms. Dimas Aguas confirms that after taking the patient to [facility] on 02/16/20, she sold her gun on 02/16/20 and states that her and the patient no longer have any guns/weapons in their home and no longer have access to any guns or weapons.  Ms. Dimas Aguas denies the patient making any threats of wanting to kill himself.  Ms. Dimas Aguas denies the patient ever exhibiting HI in the past.  Ms. Dimas Aguas states that she lives with the patient and their 62-year-old child in [place].  She can contract for patient's safety and states that she feels safe having the patient back at home with her and their child.   After speaking with the patient and obtaining collateral from the patient's girlfriend, I notified the patient that my recommendation was still for the patient to remain in [facility] continuous observation/assessment for safety reasons and further stabilization and treatment, with the patient being reevaluated on the morning of February 17, 2020 and future disposition to be determined at that time.  Despite my recommendation, patient remained adamant that he wanted to be discharged and did not want to be at the [facility] anymore. Due to patient denying SI, HI, not appearing psychotic, and confirming that the patient had no access to weapons at this time, patient does not meet IVC criteria at this time.  After discussing options with the patient, patient decided to leave the behavioral health urgent care AMA.  AMA form completed by patient prior to discharge.   Stay Summary: Per Chart Review: [facility] and was assessed by Dr. Lucianne MussKumar at that time. [facility], patient was reporting that he was feeling overwhelmed and struggling with intermittent depression.  Patient stated at that time that he had taken a gun but decided not to do anything with the gun and  wanted to get help. [facility], Dr. Lucianne MussKumar recommended patient to be admitted to the behavioral health urgent care for continuous assessment for crisis stabilization and medication management.  Patient was then assessed by Hillery Jacksanika Lewis, NP at the behavioral health urgent care on February 16, 2020.  Patient was requesting discharge from the behavioral health urgent care upon assessment by Hillery Jacksanika Lewis, NP.  Patient denied SI or HI at that time and was encouraged to continue behavioral health urgent care overnight observation/assessment with initiation of Vistaril.  Patient agreed to this plan at that time and was admitted to the behavior health urgent care continuous observation and started on Vistaril at that time.   Later on in the evening of February 16, 2020, patient requested to be discharged from the behavioral health urgent care.  After speaking with the patient and obtaining collateral from the patient's girlfriend, I notified the patient that my recommendation was still for the patient to [facility]/assessment for safety reasons and further stabilization and treatment, with the patient being reevaluated on the morning of February 17, 2020 and future disposition to be determined at that time. Due to patient denying SI, HI, not appearing psychotic, and confirming that the patient had no access to weapons at this time, patient does not meet IVC criteria at this time.  After discussing options with the patient, patient decided to leave the behavioral health urgent care AMA.  AMA form completed by patient prior to discharge.   Total Time spent with patient: 30 minutes  Past Psychiatric History: None (Per Patient)  Past Medical History:  Past Medical History:  Diagnosis Date  . Asthma    No past surgical history on file. Family History:  Family History  Problem Relation Age of Onset  . Healthy Mother    Family Psychiatric History: None (Per Patient)  Social History:   Social History   Substance and Sexual Activity  Alcohol Use Yes     Social History   Substance and Sexual Activity  Drug Use Yes  . Types: Marijuana    Social History   Socioeconomic History  . Marital status: Single    Spouse name: Not on file  . Number of children: Not on file  . Years of education: Not on file  . Highest education level: Not on file  Occupational History  . Not on file  Tobacco Use  . Smoking status: Never Smoker  . Smokeless tobacco: Never Used  Vaping Use  . Vaping Use: Never used  Substance and Sexual Activity  . Alcohol use: Yes  . Drug use: Yes    Types: Marijuana  . Sexual activity: Not on file  Other Topics Concern  . Not on file  Social History Narrative   ** Merged History Encounter **       Social Determinants of Health   Financial Resource Strain: Not on file  Food Insecurity: Not on file  Transportation Needs: Not on  file  Physical Activity: Not on file  Stress: Not on file  Social Connections: Not on file   SDOH:  SDOH Screenings   Alcohol Screen: Not on file  Depression (PHQ2-9): Medium Risk  . PHQ-2 Score: 20  Financial Resource Strain: Not on file  Food Insecurity: Not on file  Housing: Not on file  Physical Activity: Not on file  Social Connections: Not on file  Stress: Not on file  Tobacco Use: Low Risk   . Smoking Tobacco Use: Never Smoker  . Smokeless Tobacco Use: Never Used  Transportation Needs: Not on file    Has this patient used any form of tobacco in the last 30 days? (Cigarettes, Smokeless Tobacco, Cigars, and/or Pipes) Prescription not provided because: patient does not use tobacco products  Current Medications:  Current Facility-Administered Medications  Medication Dose Route Frequency Provider Last Rate Last Admin  . acetaminophen (TYLENOL) tablet 650 mg  650 mg Oral Q6H PRN Leevy-Johnson, Brooke A, NP      . alum & mag hydroxide-simeth (MAALOX/MYLANTA) 200-200-20 MG/5ML suspension 30 mL  30 mL  Oral Q4H PRN Leevy-Johnson, Brooke A, NP      . hydrOXYzine (ATARAX/VISTARIL) tablet 25 mg  25 mg Oral Q4H PRN Oneta Rack, NP   25 mg at 02/16/20 1804  . magnesium hydroxide (MILK OF MAGNESIA) suspension 30 mL  30 mL Oral Daily PRN Leevy-Johnson, Brooke A, NP       Current Outpatient Medications  Medication Sig Dispense Refill  . [START ON 02/17/2020] hydrOXYzine (ATARAX/VISTARIL) 25 MG tablet Take 1 tablet (25 mg total) by mouth 3 (three) times daily as needed for anxiety. 60 tablet 0    PTA Medications: (Not in a hospital admission)   Musculoskeletal  Strength & Muscle Tone: within normal limits Gait & Station: normal Patient leans: N/A  Psychiatric Specialty Exam  Presentation  General Appearance: Appropriate for Environment; Casual; Well Groomed  Eye Contact:Good  Speech:Clear and Coherent; Normal Rate  Speech Volume:Normal  Handedness:Right   Mood and Affect  Mood:Euthymic  Affect:Congruent   Thought Process  Thought Processes:Coherent; Goal Directed; Linear  Descriptions of Associations:Intact  Orientation:Full (Time, Place and Person)  Thought Content:Logical; WDL  Hallucinations:Hallucinations: None  Ideas of Reference:None  Suicidal Thoughts:Suicidal Thoughts: No  Homicidal Thoughts:Homicidal Thoughts: No   Sensorium  Memory:Immediate Good; Recent Good; Remote Good  Judgment:Fair  Insight:Fair   Executive Functions  Concentration:Fair  Attention Span:Fair  Recall:Good  Fund of Knowledge:Good  Language:Good   Psychomotor Activity  Psychomotor Activity:Psychomotor Activity: Normal   Assets  Assets:Communication Skills; Desire for Improvement; Housing; Leisure Time; Physical Health; Resilience; Social Support; Transportation   Sleep  Sleep:Sleep: Fair   Physical Exam  Physical Exam Vitals reviewed.  Constitutional:      General: He is not in acute distress.    Appearance: He is not ill-appearing, toxic-appearing or  diaphoretic.  HENT:     Head: Normocephalic and atraumatic.     Right Ear: External ear normal.     Left Ear: External ear normal.  Cardiovascular:     Rate and Rhythm: Normal rate.  Pulmonary:     Effort: Pulmonary effort is normal. No respiratory distress.  Musculoskeletal:        General: Normal range of motion.     Cervical back: Normal range of motion.  Neurological:     Mental Status: He is alert and oriented to person, place, and time.  Psychiatric:        Attention and Perception: Attention  and perception normal. He does not perceive auditory or visual hallucinations.        Speech: Speech normal.        Behavior: Behavior is not slowed, aggressive, withdrawn, hyperactive or combative. Behavior is cooperative.        Thought Content: Thought content is not paranoid or delusional. Thought content does not include homicidal or suicidal ideation.     Comments: Mood is euthymic with congruent affect. Patient appears slightly irritable, but is cooperative. Judgement fair and insight fair.     Review of Systems  Constitutional: Negative for chills, diaphoresis, fever, malaise/fatigue and weight loss.  HENT: Negative for congestion.   Respiratory: Negative for cough and shortness of breath.   Cardiovascular: Negative for chest pain and palpitations.  Gastrointestinal: Negative for abdominal pain, constipation, diarrhea, nausea and vomiting.  Musculoskeletal: Negative for joint pain and myalgias.  Neurological: Negative for dizziness, seizures and headaches.  Psychiatric/Behavioral: Positive for depression and substance abuse. Negative for hallucinations, memory loss and suicidal ideas. The patient is nervous/anxious.   All other systems reviewed and are negative.    Vitals: Blood pressure 129/80, pulse 74, temperature (!) 97.5 F (36.4 C), temperature source Oral, resp. rate 18, height 5\' 8"  (1.727 m), weight 130 lb (59 kg), SpO2 100 %. Body mass index is 19.77  kg/m.  Demographic Factors:  Male and Adolescent or young adult  Loss Factors: NA  Historical Factors: NA  Risk Reduction Factors:   Responsible for children under 63 years of age, Sense of responsibility to family, Living with another person, especially a relative, Positive social support and Positive coping skills or problem solving skills  Continued Clinical Symptoms:  Depression:   Severe  Cognitive Features That Contribute To Risk:  None    Suicide Risk:  Minimal: No identifiable suicidal ideation.  Patients presenting with no risk factors but with morbid ruminations; may be classified as minimal risk based on the severity of the depressive symptoms  Plan Of Care/Follow-up recommendations:  Activity:  As tolerated Diet:  No Dietary Restrictions  Tests:  None Other:  Follow up with Psychiatry and Therapy at [facility] Outpatient Walk-In/Open Access Hours   After speaking with the patient and obtaining collateral from the patient's girlfriend, I notified the patient that my recommendation was still for the patient to remain in [facility] continuous observation/assessment for safety reasons and further stabilization and treatment, with the patient being reevaluated on the morning of February 17, 2020 and future disposition to be determined at that time.  Despite my recommendation, patient remained adamant that he wanted to be discharged and did not want to be at the [facility] anymore.  Due to patient denying SI, HI, not appearing psychotic, and confirming that the patient had no access to weapons at this time, patient does not meet IVC criteria at this time.  After discussing options with the patient, patient decided to leave the behavioral health urgent care AMA.  AMA form completed by patient prior to discharge.  Patient states that he would like to be see a psychiatrist and therapist as an outpatient.  Patient provided with resources information for the following resources for  psychiatry/medication management and therapy services:  -[facility] second floor outpatient open Access/walk-in hours (Specific Hours Included in Patient's AVS)  -Resource Printout sheet containing list of outpatient psychiatry/therapy resources in the area  -Psychology today.com: Patient was notified that he can use this resource as well to find psychiatry and therapy services in his area.  Patient [facility] for psychiatry and therapy.  He states that if he is unable to be seen at open Access hours, he will attempt to schedule psychiatry and therapy appointments through the other resources provided to him.  Patient also states that he believes the Vistaril is helping with his anxiety and would like to continue the Vistaril.  Prescription for Vistaril 25 mg p.o. 3 times daily as needed for anxiety (60 tablets, 0 refills) sent to patient's pharmacy of choice.  Recommended that the patient notify the psychiatrist that he follows up with as an outpatient about the Vistaril prescription and illicit the psychiatrist's recommendations about continuing/discontinuing/altering the Vistaril medication, as well as adding additional medications to the patient's medication regimen.  Patient verbalizes agreement to this plan. Safety planning done at length with the patient regarding appropriate actions to take/resources to utilize (closest ED, [facility], 911, etc.) if the patient becomes suicidal or homicidal or if his condition rapidly deteriorates/worsens/does not improve.  Patient verbalizes understanding and agreement of this plan.  These actions to take/resources are provided in the patient's AVS as well. Patient verbalizes understanding and agreement of the plan. All of patient's questions answered.  All of patient's concerns were addressed. Patient's girlfriend can contract for patient's safety at  home. [facility].  AMA form completed by patient prior to discharge.  Disposition:  [facility]  AMA form completed by patient prior to discharge.  Patient's girlfriend to contract for patient's safety at home.  Patient's [facility].  Patient discharged home AMA with his girlfriend.   Jaclyn ShaggyCody W Cayden Rautio, PA-C 02/16/2020, 11:08 PM

## 2020-02-16 NOTE — ED Notes (Signed)
Patient says his refusing treatment, therefore he should be discharged to go home." This place is increasing my anxiety

## 2020-02-16 NOTE — Discharge Instructions (Addendum)
  Discharge recommendations:  Patient is to take medications as prescribed. Please see information for follow-up appointment with psychiatry and therapy. Please follow up with your primary care provider for all medical related needs.   Therapy: We recommend that patient participate in individual therapy to address mental health concerns.  Medications: The patient is to contact a medical professional and/or outpatient provider to address any new side effects that develop. Patient should update outpatient providers of any new medications and/or medication changes.   Safety:  The patient should abstain from use of illicit substances/drugs and abuse of any medications. If symptoms worsen or do not continue to improve or if the patient becomes actively suicidal or homicidal then it is recommended that the patient return to the closest hospital emergency department, the [facility], or call 911 for further evaluation and treatment. National Suicide Prevention Lifeline 1-800-SUICIDE or 1-800-273-8255.  

## 2020-02-16 NOTE — ED Notes (Signed)
Lunch given.

## 2020-06-17 ENCOUNTER — Ambulatory Visit: Payer: Medicaid Other | Attending: Internal Medicine

## 2020-06-17 DIAGNOSIS — Z20822 Contact with and (suspected) exposure to covid-19: Secondary | ICD-10-CM

## 2020-06-18 LAB — SARS-COV-2, NAA 2 DAY TAT

## 2020-06-18 LAB — NOVEL CORONAVIRUS, NAA: SARS-CoV-2, NAA: NOT DETECTED

## 2020-08-14 ENCOUNTER — Ambulatory Visit: Payer: Medicaid Other | Attending: Internal Medicine

## 2020-08-14 DIAGNOSIS — Z20822 Contact with and (suspected) exposure to covid-19: Secondary | ICD-10-CM

## 2020-08-15 LAB — SARS-COV-2, NAA 2 DAY TAT

## 2020-08-15 LAB — NOVEL CORONAVIRUS, NAA: SARS-CoV-2, NAA: NOT DETECTED

## 2020-09-18 ENCOUNTER — Other Ambulatory Visit: Payer: Self-pay

## 2020-09-18 ENCOUNTER — Encounter (HOSPITAL_COMMUNITY): Payer: Self-pay | Admitting: Emergency Medicine

## 2020-09-18 ENCOUNTER — Emergency Department (HOSPITAL_COMMUNITY)
Admission: EM | Admit: 2020-09-18 | Discharge: 2020-09-18 | Disposition: A | Discharge status: Home / Self Care | Payer: PRIVATE HEALTH INSURANCE | Attending: Emergency Medicine | Admitting: Emergency Medicine

## 2020-09-18 DIAGNOSIS — R0602 Shortness of breath: Secondary | ICD-10-CM | POA: Diagnosis present

## 2020-09-18 DIAGNOSIS — R000 Tachycardia, unspecified: Secondary | ICD-10-CM | POA: Diagnosis not present

## 2020-09-18 DIAGNOSIS — R109 Unspecified abdominal pain: Secondary | ICD-10-CM | POA: Diagnosis not present

## 2020-09-18 DIAGNOSIS — U071 COVID-19: Secondary | ICD-10-CM | POA: Diagnosis not present

## 2020-09-18 DIAGNOSIS — R112 Nausea with vomiting, unspecified: Secondary | ICD-10-CM | POA: Insufficient documentation

## 2020-09-18 LAB — CBC WITH DIFFERENTIAL/PLATELET
Abs Immature Granulocytes: 0.03 10*3/uL (ref 0.00–0.07)
Basophils Absolute: 0 10*3/uL (ref 0.0–0.1)
Basophils Relative: 0 %
Eosinophils Absolute: 0 10*3/uL (ref 0.0–0.5)
Eosinophils Relative: 0 %
HCT: 41.3 % (ref 39.0–52.0)
Hemoglobin: 13.8 g/dL (ref 13.0–17.0)
Immature Granulocytes: 0 %
Lymphocytes Relative: 7 %
Lymphs Abs: 0.6 10*3/uL — ABNORMAL LOW (ref 0.7–4.0)
MCH: 30.7 pg (ref 26.0–34.0)
MCHC: 33.4 g/dL (ref 30.0–36.0)
MCV: 91.8 fL (ref 80.0–100.0)
Monocytes Absolute: 0.5 10*3/uL (ref 0.1–1.0)
Monocytes Relative: 5 %
Neutro Abs: 7.6 10*3/uL (ref 1.7–7.7)
Neutrophils Relative %: 88 %
Platelets: 208 10*3/uL (ref 150–400)
RBC: 4.5 MIL/uL (ref 4.22–5.81)
RDW: 13 % (ref 11.5–15.5)
WBC: 8.7 10*3/uL (ref 4.0–10.5)
nRBC: 0 % (ref 0.0–0.2)

## 2020-09-18 LAB — LACTIC ACID, PLASMA: Lactic Acid, Venous: 0.8 mmol/L (ref 0.5–1.9)

## 2020-09-18 LAB — COMPREHENSIVE METABOLIC PANEL
ALT: 17 U/L (ref 0–44)
AST: 17 U/L (ref 15–41)
Albumin: 4.4 g/dL (ref 3.5–5.0)
Alkaline Phosphatase: 73 U/L (ref 38–126)
Anion gap: 7 (ref 5–15)
BUN: 15 mg/dL (ref 6–20)
CO2: 25 mmol/L (ref 22–32)
Calcium: 9.5 mg/dL (ref 8.9–10.3)
Chloride: 106 mmol/L (ref 98–111)
Creatinine, Ser: 0.93 mg/dL (ref 0.61–1.24)
GFR, Estimated: >60 mL/min (ref >60)
Glucose, Bld: 95 mg/dL (ref 70–99)
Potassium: 3.7 mmol/L (ref 3.5–5.1)
Sodium: 138 mmol/L (ref 135–145)
Total Bilirubin: 1 mg/dL (ref 0.3–1.2)
Total Protein: 7.5 g/dL (ref 6.5–8.1)

## 2020-09-18 LAB — LIPASE, BLOOD: Lipase: 27 U/L (ref 11–51)

## 2020-09-18 LAB — RESP PANEL BY RT-PCR (FLU A&B, COVID) ARPGX2
Influenza A by PCR: NEGATIVE
Influenza B by PCR: NEGATIVE
SARS Coronavirus 2 by RT PCR: POSITIVE — AB

## 2020-09-18 MED ORDER — ONDANSETRON HCL 4 MG/2ML IJ SOLN
4.0000 mg | Freq: Once | INTRAMUSCULAR | Status: DC
  Filled 2020-09-18: qty 2

## 2020-09-18 MED ORDER — SODIUM CHLORIDE 0.9 % IV BOLUS: 1000.0000 mL | Freq: Once | INTRAVENOUS | Status: DC

## 2020-09-18 MED ORDER — ACETAMINOPHEN 500 MG PO TABS
1000.0000 mg | ORAL_TABLET | Freq: Once | ORAL | Status: AC
  Administered 2020-09-18: 1000 mg via ORAL
  Filled 2020-09-18: qty 2

## 2020-09-18 MED ORDER — ONDANSETRON HCL 4 MG PO TABS: 4.0000 mg | ORAL_TABLET | Freq: Four times a day (QID) | ORAL | 0 refills | Status: AC

## 2020-09-18 NOTE — Discharge Instructions (Addendum)
Return for any problem.  ?

## 2020-09-18 NOTE — ED Notes (Signed)
Pt refusing IV at this time, will hold bolus and Zofran

## 2020-09-18 NOTE — ED Notes (Signed)
Patient is refusing IV but will allow labs to be drawn

## 2020-09-18 NOTE — ED Triage Notes (Addendum)
Pt c/o emesis, chills, diarrhea, fever, SOB when lying down, chest pressure and back pain. Symptoms started this morning.  Pt denies any known recent covid exposure.

## 2020-09-18 NOTE — ED Provider Notes (Signed)
[facility]-EMERGENCY DEPT Provider Note   CSN: [id number] Arrival date & time: 09/18/20  1658     History Chief Complaint  Patient presents with   Emesis   Shortness of Breath    Noah Hanson is a 25 y.o. male.  25 year old male with prior medical history as detailed below.  Patient complains of 12 hours of nausea, vomiting, abdominal cramps, and subjective fever.  Patient reports multiple episodes of emesis.  He denies bloody emesis.  He reports loose watery stools.  He denies blood in stools.  He has not take anything specifically for symptoms.  He smokes pot every day.  He reports that when he smoked marijuana today he did not feel that good.  The history is provided by the patient.  Emesis Severity:  Mild Duration:  12 hours Timing:  Constant Able to tolerate:  Liquids Progression:  Unchanged Chronicity:  New Shortness of Breath Associated symptoms: vomiting       Past Medical History:  Diagnosis Date   Asthma     Patient Active Problem List   Diagnosis Date Noted   MDD (major depressive disorder), single episode, moderate (HCC) 02/16/2020   Suicidal ideation 02/16/2020    History reviewed. No pertinent surgical history.     Family History  Problem Relation Age of Onset   Healthy Mother     Social History   Tobacco Use   Smoking status: Never   Smokeless tobacco: Never  Vaping Use   Vaping Use: Never used  Substance Use Topics   Alcohol use: Yes   Drug use: Yes    Types: Marijuana    Home Medications Prior to Admission medications   Medication Sig Start Date End Date Taking? Authorizing Provider  hydrOXYzine (ATARAX/VISTARIL) 25 MG tablet Take 1 tablet (25 mg total) by mouth 3 (three) times daily as needed for anxiety. 02/17/20   Jaclyn Shaggy, PA-C    Allergies    Patient has no known allergies.  Review of Systems   Review of Systems  Respiratory:  Positive for shortness of breath.   Gastrointestinal:   Positive for vomiting.  All other systems reviewed and are negative.  Physical Exam Updated Vital Signs BP 109/78 (BP Location: Left Arm) Comment: Simultaneous filing. User may not have seen previous data.  Pulse 98 Comment: Simultaneous filing. User may not have seen previous data.  Temp (!) 100.4 F (38 C) (Oral) Comment: Simultaneous filing. User may not have seen previous data.  Resp 17 Comment: Simultaneous filing. User may not have seen previous data.  Ht 5\' 8"  (1.727 m)   Wt 59 kg   SpO2 99% Comment: Simultaneous filing. User may not have seen previous data.  BMI 19.78 kg/m   Physical Exam Vitals and nursing note reviewed.  Constitutional:      General: He is not in acute distress.    Appearance: Normal appearance. He is well-developed.  HENT:     Head: Normocephalic and atraumatic.  Eyes:     Conjunctiva/sclera: Conjunctivae normal.     Pupils: Pupils are equal, round, and reactive to light.  Cardiovascular:     Rate and Rhythm: Normal rate and regular rhythm.     Heart sounds: Normal heart sounds.  Pulmonary:     Effort: Pulmonary effort is normal. No respiratory distress.     Breath sounds: Normal breath sounds.  Abdominal:     General: There is no distension.     Palpations: Abdomen is soft.  Tenderness: There is no abdominal tenderness.  Musculoskeletal:        General: No deformity. Normal range of motion.     Cervical back: Normal range of motion and neck supple.  Skin:    General: Skin is warm and dry.  Neurological:     General: No focal deficit present.     Mental Status: He is alert and oriented to person, place, and time.    ED Results / Procedures / Treatments   Labs (all labs ordered are listed, but only abnormal results are displayed) Labs Reviewed  RESP PANEL BY RT-PCR (FLU A&B, COVID) [id number]  COMPREHENSIVE METABOLIC PANEL  CBC WITH DIFFERENTIAL/PLATELET  LACTIC ACID, PLASMA  LACTIC ACID, PLASMA  LIPASE, BLOOD    EKG EKG  Interpretation  Date/Time:  Thursday September 18 2020 17:10:54 EDT Ventricular Rate:  102 PR Interval:  144 QRS Duration: 77 QT Interval:  236 QTC Calculation: 308 R Axis:   91 Text Interpretation: Sinus tachycardia Consider right atrial enlargement Borderline right axis deviation LVH by voltage Confirmed by Kristine Royal [phone]) on 09/18/2020 5:26:41 PM  Radiology No results found.  Procedures Procedures   Medications Ordered in ED Medications  sodium chloride 0.9 % bolus 1,000 mL (has no administration in time range)  ondansetron (ZOFRAN) injection 4 mg (has no administration in time range)  acetaminophen (TYLENOL) tablet 1,000 mg (has no administration in time range)    ED Course  I have reviewed the triage vital signs and the nursing notes.  Pertinent labs & imaging results that were available during my care of the patient were reviewed by me and considered in my medical decision making (see chart for details).    MDM Rules/Calculators/A&P                           MDM  MSE complete  Noah Hanson was evaluated in Emergency Department on 09/18/2020 for the symptoms described in the history of present illness. He was evaluated in the context of the global COVID-19 pandemic, which necessitated consideration that the patient might be at risk for infection with the SARS-CoV-2 virus that causes COVID-19. Institutional protocols and algorithms that pertain to the evaluation of patients at risk for COVID-19 are in a state of rapid change based on information released by regulatory bodies including the CDC and federal and state organizations. These policies and algorithms were followed during the patient's care in the ED.  Presents with approximately 12 hours of nausea and vomiting.  Patient with recorded fever here. Patient's symptoms are most likely related to early COVID-19 infection.  Screening labs -other than a positive COVID screen -are without significant  abnormality.  Patient refused IV and IV fluids.  Patient feels improved after ED evaluation and treatment.  He does understand need for close follow-up.  Strict return precautions given and understood.  Patient with prior history of previous COVID infection.  Patient is without risk factors for significant progression of illness related to COVID.     Final Clinical Impression(s) / ED Diagnoses Final diagnoses:  Nausea and vomiting, intractability of vomiting not specified, unspecified vomiting type  COVID-19    Rx / DC Orders ED Discharge Orders          Ordered    ondansetron (ZOFRAN) 4 MG tablet  Every 6 hours        09/18/20 2050             Wynetta Fines,  MD 09/18/20 2053

## 2020-11-13 IMAGING — CR RIGHT ANKLE - COMPLETE 3+ VIEW
3 series · 3 of 3 positions shown · non-contrast
Comparison: 09/22/2017

CLINICAL DATA: Medial ankle pain, injury, difficulty bearing weight

EXAM:
RIGHT ANKLE - COMPLETE 3+ VIEW

[x ankle ap right]
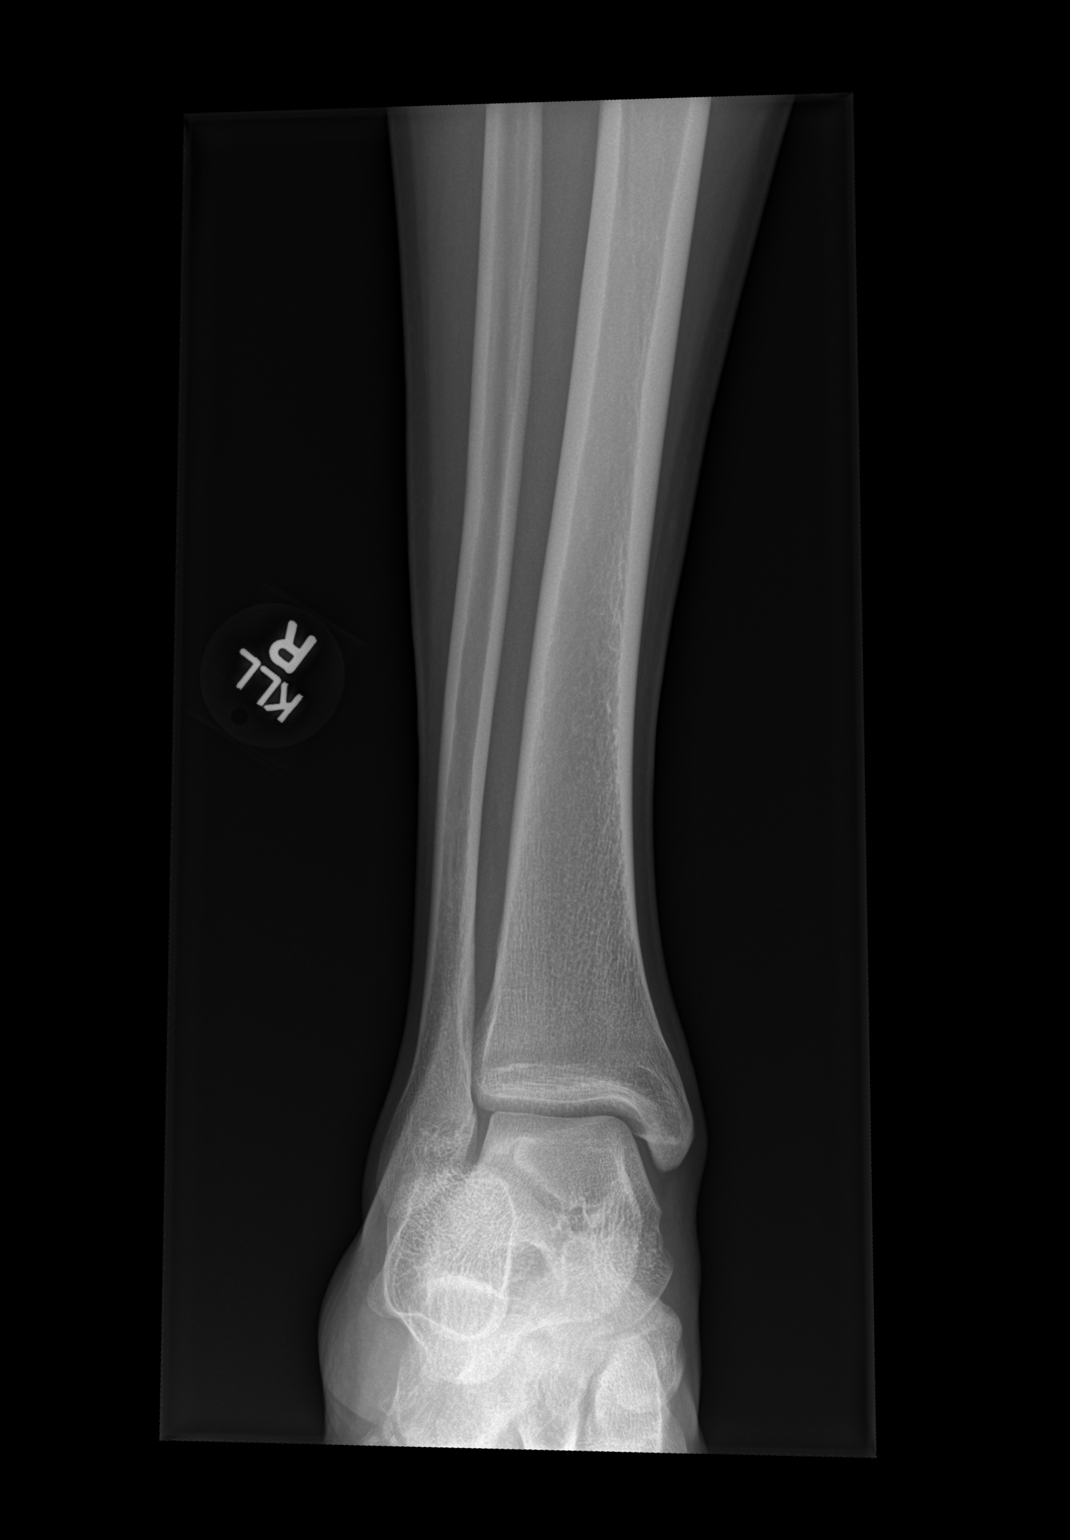

[x ankle obl right]
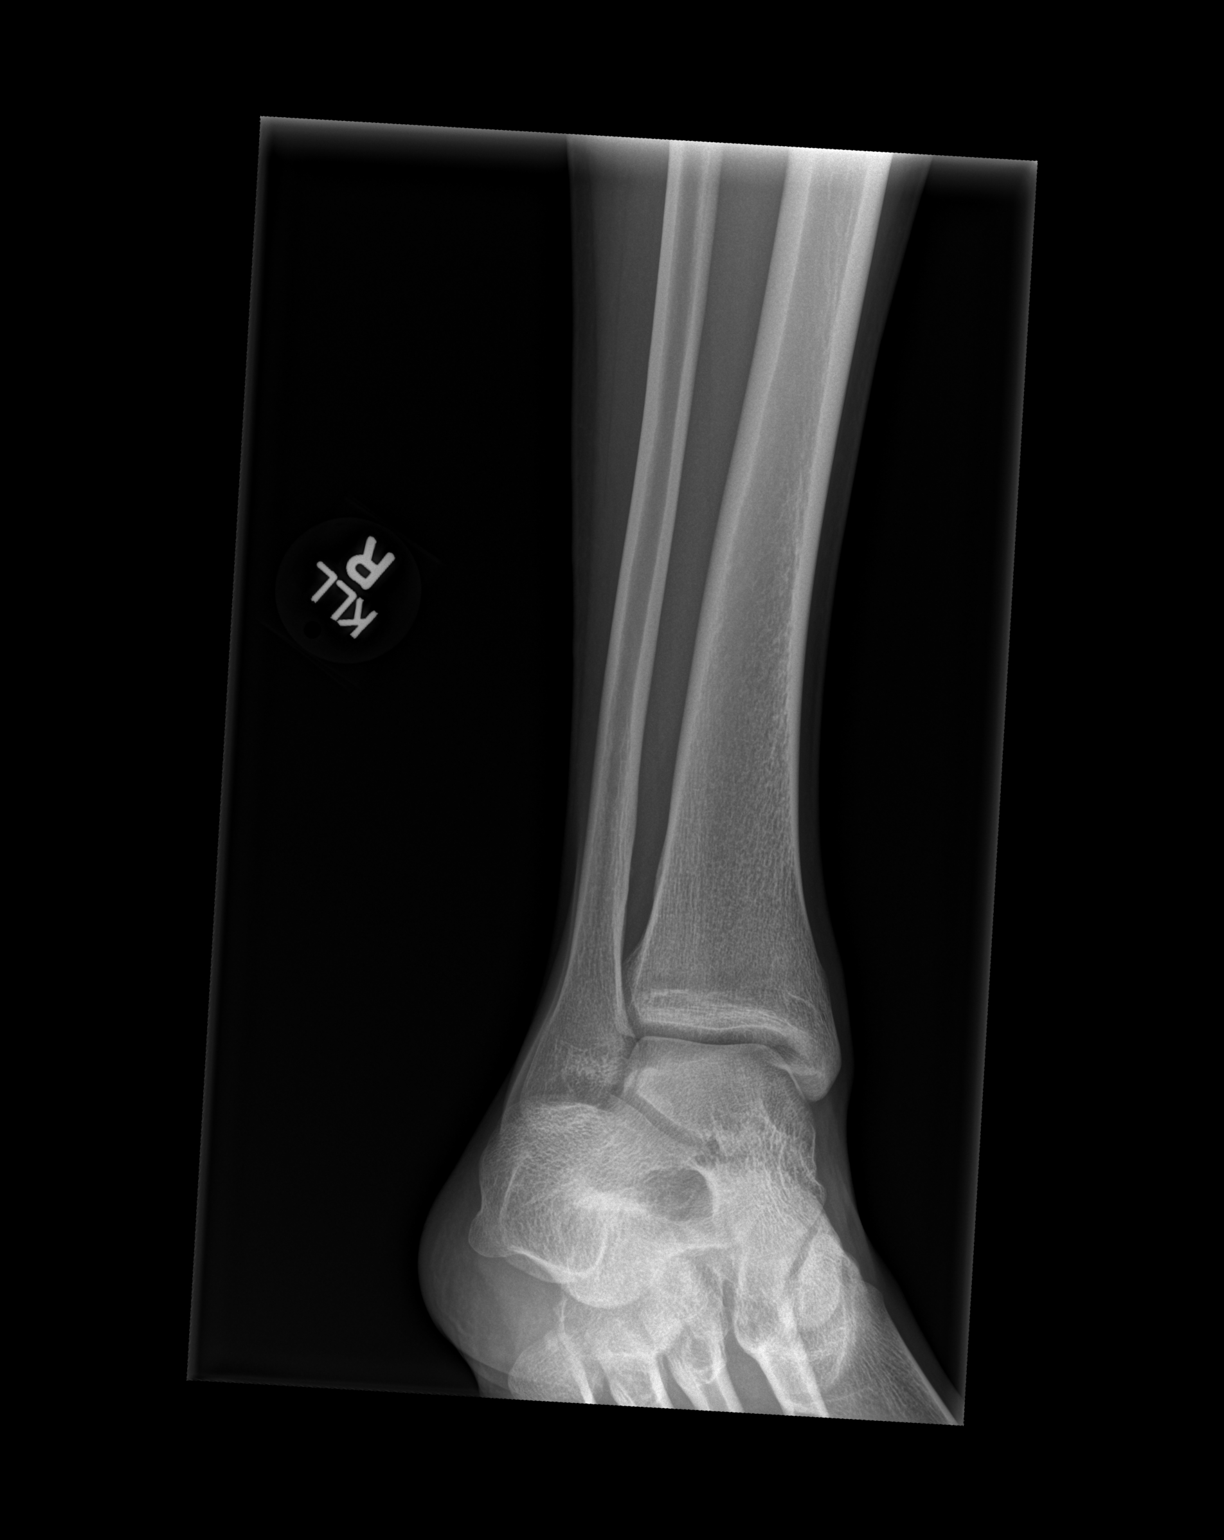

[x ankle lat right]
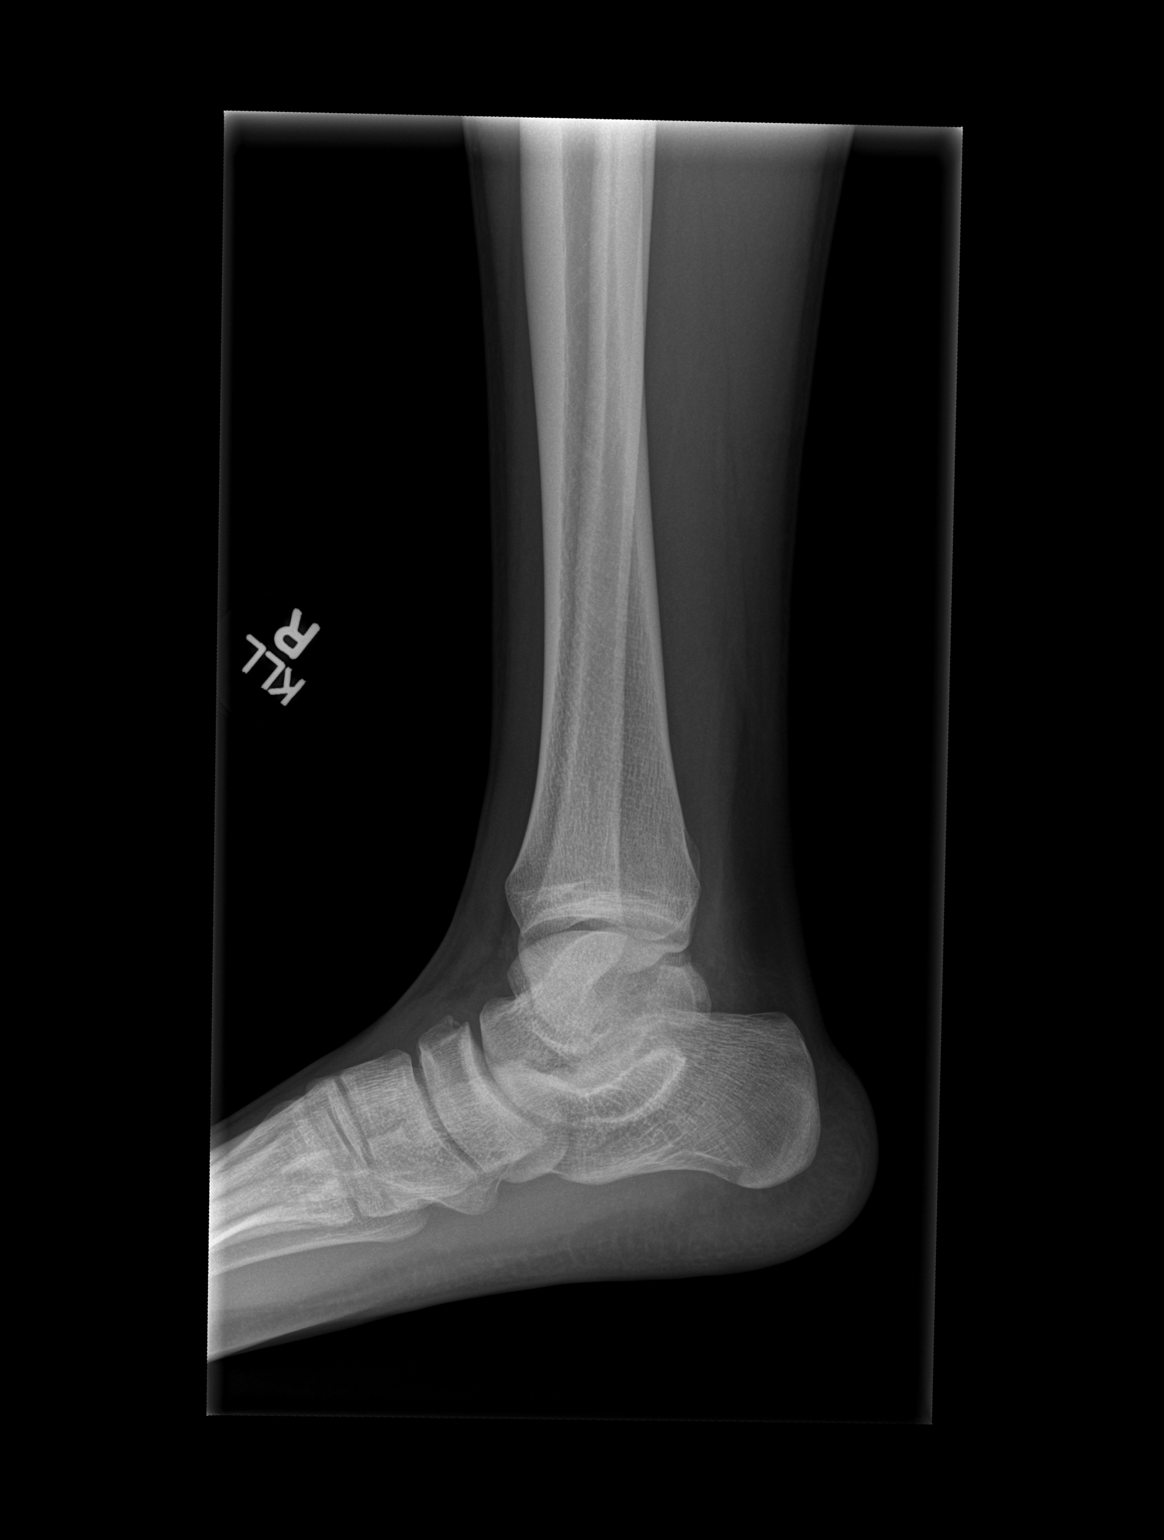

[3 of 3 positions shown; findings below may reference images not displayed]

FINDINGS: There is no evidence of fracture, dislocation, or joint effusion.
There is no evidence of arthropathy or other focal bone abnormality.
Soft tissues are unremarkable.
IMPRESSION: No acute osseous finding or malalignment.

## 2021-03-07 ENCOUNTER — Emergency Department (HOSPITAL_COMMUNITY)
Admission: EM | Admit: 2021-03-07 | Discharge: 2021-03-07 | Disposition: A | Discharge status: Home / Self Care | Payer: PRIVATE HEALTH INSURANCE | Attending: Emergency Medicine | Admitting: Emergency Medicine

## 2021-03-07 ENCOUNTER — Other Ambulatory Visit: Payer: Self-pay

## 2021-03-07 ENCOUNTER — Encounter (HOSPITAL_COMMUNITY): Payer: Self-pay

## 2021-03-07 DIAGNOSIS — Z79899 Other long term (current) drug therapy: Secondary | ICD-10-CM | POA: Insufficient documentation

## 2021-03-07 DIAGNOSIS — K0889 Other specified disorders of teeth and supporting structures: Secondary | ICD-10-CM | POA: Diagnosis present

## 2021-03-07 DIAGNOSIS — K069 Disorder of gingiva and edentulous alveolar ridge, unspecified: Secondary | ICD-10-CM | POA: Diagnosis not present

## 2021-03-07 DIAGNOSIS — K068 Other specified disorders of gingiva and edentulous alveolar ridge: Secondary | ICD-10-CM

## 2021-03-07 MED ORDER — PENICILLIN V POTASSIUM 500 MG PO TABS: 500.0000 mg | ORAL_TABLET | Freq: Four times a day (QID) | ORAL | 0 refills | Status: AC

## 2021-03-07 MED ORDER — PENICILLIN V POTASSIUM 500 MG PO TABS: 500.0000 mg | ORAL_TABLET | Freq: Four times a day (QID) | ORAL | 0 refills | Status: DC

## 2021-03-07 MED ORDER — NAPROXEN 500 MG PO TABS: 500.0000 mg | ORAL_TABLET | Freq: Two times a day (BID) | ORAL | 0 refills | Status: AC

## 2021-03-07 NOTE — Discharge Instructions (Addendum)
You were given a prescription for antibiotics. Please take the antibiotic prescription fully.   Take the pain medication as prescribed  Please follow-up with a dentist tomorrow as scheduled If you do not have a dentist, resources were provided for dentist in the area in your discharge summary.  Please contact one of the offices that are listed and make an appointment for follow-up.  Please return to the emergency department for any new or worsening symptoms.

## 2021-03-07 NOTE — ED Provider Notes (Signed)
[facility] DEPT Provider Note   CSN: [id number] Arrival date & time: 03/07/21  [id number]     History  Chief Complaint  Patient presents with   Dental Pain    Noah Hanson is a 26 y.o. male.  HPI  26 year old male presents emergency department today for evaluation of pain to his gums and teeth.  He states he has had pain to the lower gums for the last 3 to 4 days.  Has tried ibuprofen at home without relief.  States for the last week he has been rinsing his mouth with coconut oil and water which is the only recent change he and his dental routine.  No swelling to the gumline, no fevers.  No facial swelling.  Has an appointment with a dentist tomorrow.  Home Medications Prior to Admission medications   Medication Sig Start Date End Date Taking? Authorizing Provider  naproxen (NAPROSYN) 500 MG tablet Take 1 tablet (500 mg total) by mouth 2 (two) times daily for 7 days. 03/07/21 03/14/21 Yes Rachele Lamaster S, PA-C  hydrOXYzine (ATARAX/VISTARIL) 25 MG tablet Take 1 tablet (25 mg total) by mouth 3 (three) times daily as needed for anxiety. 02/17/20   Prescilla Sours, PA-C  ondansetron (ZOFRAN) 4 MG tablet Take 1 tablet (4 mg total) by mouth every 6 (six) hours. 09/18/20   Valarie Merino, MD  penicillin v potassium (VEETID) 500 MG tablet Take 1 tablet (500 mg total) by mouth 4 (four) times daily for 7 days. 03/07/21 03/14/21  Galen Malkowski S, PA-C      Allergies    Patient has no known allergies.    Review of Systems   Review of Systems See HPI for pertinent positives or negatives.   Physical Exam Updated Vital Signs BP 126/84 (BP Location: Right Arm)    Pulse 69    Temp 98.1 F (36.7 C) (Oral)    Resp 16    SpO2 100%  Physical Exam Constitutional:      General: He is not in acute distress.    Appearance: He is well-developed.  HENT:     Mouth/Throat:     Comments: Mild tenderness noted along the anterior lower gumline with some scant bleeding.  No  periapical abscess.  No trismus.  No facial swelling or sublingual swelling. Eyes:     Conjunctiva/sclera: Conjunctivae normal.  Cardiovascular:     Rate and Rhythm: Normal rate and regular rhythm.  Pulmonary:     Effort: Pulmonary effort is normal.     Breath sounds: Normal breath sounds.  Skin:    General: Skin is warm and dry.  Neurological:     Mental Status: He is alert and oriented to person, place, and time.    ED Results / Procedures / Treatments   Labs (all labs ordered are listed, but only abnormal results are displayed) Labs Reviewed - No data to display  EKG None  Radiology No results found.  Procedures Procedures    Medications Ordered in ED Medications - No data to display  ED Course/ Medical Decision Making/ A&P                           Medical Decision Making  Patient with toothache and gum pain.  No gross abscess.  Exam unconcerning for Ludwig's angina or spread of infection.  Recently has been gargling with coconut oil and water which she started doing just prior to the onset of his  symptoms.  I had advised him to stop this practice as this could be contributing to his pain.  At this time it is unclear whether symptoms are related to underlying infection but out of an abundance of caution we will start patient on penicillin.  He has an appoint with a dentist tomorrow which I encouraged him to keep.  Advised on return precautions.  He voiced understanding plan reasons to return.  Questions answered.  Patient stable for discharge.    Final Clinical Impression(s) / ED Diagnoses Final diagnoses:  Pain in gums    Rx / DC Orders ED Discharge Orders          Ordered    penicillin v potassium (VEETID) 500 MG tablet  4 times daily,   Status:  Discontinued        03/07/21 1025    naproxen (NAPROSYN) 500 MG tablet  2 times daily        03/07/21 1025    penicillin v potassium (VEETID) 500 MG tablet  4 times daily        03/07/21 [address], Zaya Kessenich S, PA-C 03/07/21 1028    Isla Pence, MD 03/07/21 1156

## 2021-03-07 NOTE — ED Triage Notes (Signed)
Pt reports lower front dental pain x3-4 days.

## 2021-04-08 ENCOUNTER — Other Ambulatory Visit: Payer: Self-pay

## 2021-04-08 ENCOUNTER — Emergency Department (HOSPITAL_COMMUNITY)
Admission: EM | Admit: 2021-04-08 | Discharge: 2021-04-08 | Disposition: A | Discharge status: Home / Self Care | Payer: PRIVATE HEALTH INSURANCE | Attending: Emergency Medicine | Admitting: Emergency Medicine

## 2021-04-08 ENCOUNTER — Encounter (HOSPITAL_COMMUNITY): Payer: Self-pay

## 2021-04-08 DIAGNOSIS — J45909 Unspecified asthma, uncomplicated: Secondary | ICD-10-CM | POA: Insufficient documentation

## 2021-04-08 DIAGNOSIS — R519 Headache, unspecified: Secondary | ICD-10-CM | POA: Insufficient documentation

## 2021-04-08 NOTE — ED Provider Notes (Signed)
?  [facility]-EMERGENC[facility] DEPT ?Provider Note ? ? ?CSN: [id number] ?Arrival date & time: 04/08/21  1843 ? ?  ? ?History ? ?Chief Complaint  ?Patient presents with  ? Migraine  ? ? ?Noah Hanson is a 26 y.o. male.  Patient presents to the emergency department with a complaint of a migraine type headache earlier in the day.  He is asymptomatic at this time.  The patient has no complaints currently he states that he is here for a note stating that it is okay for him to go to work tomorrow.  PMH significant for asthma, major depressive disorder, possible migraine history ? ?HPI ? ?  ? ?Home Medications ?Prior to Admission medications   ?Medication Sig Start Date End Date Taking? Authorizing Provider  ?hydrOXYzine (ATARAX/VISTARIL) 25 MG tablet Take 1 tablet (25 mg total) by mouth 3 (three) times daily as needed for anxiety. 02/17/20   Jaclyn Shaggy, PA-C  ?ondansetron (ZOFRAN) 4 MG tablet Take 1 tablet (4 mg total) by mouth every 6 (six) hours. 09/18/20   Wynetta Fines, MD  ?   ? ?Allergies    ?Patient has no known allergies.   ? ?Review of Systems   ?Review of Systems  ?Gastrointestinal:  Negative for abdominal pain and nausea.  ?Neurological:  Negative for headaches.  ? ?Physical Exam ?Updated Vital Signs ?BP (!) 144/95 (BP Location: Left Arm)   Pulse 99   Temp 98.3 ?F (36.8 ?C) (Oral)   Resp 18   SpO2 99%  ?Physical Exam ?Constitutional:   ?   General: He is not in acute distress. ?HENT:  ?   Head: Normocephalic.  ?Eyes:  ?   Conjunctiva/sclera: Conjunctivae normal.  ?Cardiovascular:  ?   Rate and Rhythm: Normal rate.  ?Pulmonary:  ?   Effort: Pulmonary effort is normal.  ?Musculoskeletal:     ?   General: Normal range of motion.  ?   Cervical back: Normal range of motion.  ?Skin: ?   General: Skin is warm and dry.  ?Neurological:  ?   Mental Status: He is alert.  ? ? ?ED Results / Procedures / Treatments   ?Labs ?(all labs ordered are listed, but only abnormal results are displayed) ?Labs  Reviewed - No data to display ? ?EKG ?None ? ?Radiology ?No results found. ? ?Procedures ?Procedures  ? ? ?Medications Ordered in ED ?Medications - No data to display ? ?ED Course/ Medical Decision Making/ A&P ?  ?                        ?Medical Decision Making ? ?The patient has no complaints at this time.  He had a headache earlier in the day which completely resolved.  He has had similar headaches in the past and this was not the worst headache of his life.  He states his headaches are normally accompanied by some nausea and this was similar.  The patient requested a note to return to work.  Return to work note provided.  Return precautions given regarding headache warning signs. ? ?Final Clinical Impression(s) / ED Diagnoses ?Final diagnoses:  ?Nonintractable headache, unspecified chronicity pattern, unspecified headache type  ? ? ?Rx / DC Orders ?ED Discharge Orders   ? ? None  ? ?  ? ? ?  ?Darrick Grinder, PA-C ?04/08/21 1909 ? ?  ?Virgina Norfolk, DO ?04/08/21 2042 ? ?

## 2021-04-08 NOTE — Discharge Instructions (Addendum)
You were seen today for a headache. You have no symptoms at this time. Return to the emergency department if you experience warning headache symptoms such as the worst headache of your life or other life threatening conditions.  ?

## 2021-04-08 NOTE — ED Triage Notes (Signed)
Pt reports migraine that began this morning and he reports vomiting with migraine. Pt reports migraine as subsided but is requesting work note so that he can return to work.  ?

## 2021-06-01 ENCOUNTER — Other Ambulatory Visit: Payer: Self-pay

## 2021-06-01 ENCOUNTER — Encounter (HOSPITAL_COMMUNITY): Payer: Self-pay

## 2021-06-01 ENCOUNTER — Emergency Department (HOSPITAL_COMMUNITY)
Admission: EM | Admit: 2021-06-01 | Discharge: 2021-06-01 | Discharge status: Against Medical Advice | Payer: Medicaid Other | Attending: Emergency Medicine | Admitting: Emergency Medicine

## 2021-06-01 DIAGNOSIS — Z5321 Procedure and treatment not carried out due to patient leaving prior to being seen by health care provider: Secondary | ICD-10-CM | POA: Insufficient documentation

## 2021-06-01 DIAGNOSIS — K0889 Other specified disorders of teeth and supporting structures: Secondary | ICD-10-CM | POA: Insufficient documentation

## 2021-06-01 NOTE — ED Notes (Addendum)
Pt. Called to room x3, no answer. 

## 2021-06-01 NOTE — ED Triage Notes (Signed)
Patient reports right sided dental pain related to his wisdom tooth for 4 days.  ?

## 2021-06-01 NOTE — ED Notes (Signed)
Called for patient in the lobby. No response.  ?

## 2021-06-01 NOTE — ED Notes (Signed)
Called patient on the phone with no response.  ?

## 2021-10-10 ENCOUNTER — Encounter (HOSPITAL_COMMUNITY): Payer: Self-pay

## 2021-10-10 ENCOUNTER — Emergency Department (HOSPITAL_COMMUNITY)
Admission: EM | Admit: 2021-10-10 | Discharge: 2021-10-10 | Disposition: A | Discharge status: Home / Self Care | Payer: No Typology Code available for payment source | Attending: Emergency Medicine | Admitting: Emergency Medicine

## 2021-10-10 ENCOUNTER — Other Ambulatory Visit: Payer: Self-pay

## 2021-10-10 DIAGNOSIS — Y92410 Unspecified street and highway as the place of occurrence of the external cause: Secondary | ICD-10-CM | POA: Diagnosis not present

## 2021-10-10 DIAGNOSIS — R519 Headache, unspecified: Secondary | ICD-10-CM | POA: Insufficient documentation
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
## 2021-10-10 NOTE — ED Notes (Signed)
Writer went to the patient's room to go over discharge instructions. Patient was not in the room.

## 2022-10-10 ENCOUNTER — Encounter (HOSPITAL_COMMUNITY): Payer: Self-pay

## 2022-10-10 ENCOUNTER — Ambulatory Visit (HOSPITAL_COMMUNITY)
Admission: RE | Admit: 2022-10-10 | Discharge: 2022-10-10 | Disposition: A | Discharge status: Home / Self Care | Payer: Medicaid Other | Source: Ambulatory Visit | Attending: Emergency Medicine | Admitting: Emergency Medicine

## 2022-10-10 VITALS — BP 109/72 | HR 66 | Temp 98.0°F | Resp 18 | Ht 67.0 in | Wt 130.0 lb

## 2022-10-10 DIAGNOSIS — Z1152 Encounter for screening for COVID-19: Secondary | ICD-10-CM | POA: Diagnosis not present

## 2022-10-10 DIAGNOSIS — J069 Acute upper respiratory infection, unspecified: Secondary | ICD-10-CM | POA: Insufficient documentation

## 2022-10-10 DIAGNOSIS — R0602 Shortness of breath: Secondary | ICD-10-CM | POA: Diagnosis not present

## 2022-10-10 DIAGNOSIS — B9789 Other viral agents as the cause of diseases classified elsewhere: Secondary | ICD-10-CM | POA: Insufficient documentation

## 2022-10-10 NOTE — ED Provider Notes (Signed)
[facility]    CSN: [id number] Arrival date & time: 10/10/22  1425      History   Chief Complaint Chief Complaint  Patient presents with   Covid Exposure   Nasal Congestion   Shortness of Breath    HPI Noah Hanson is a 27 y.o. male.   Patient presents to clinic for generalized bodyaches, headache, nasal congestion, intermittent shortness of breath and fatigue that started yesterday.  He was recently exposed to a coworker that had COVID-19.  He also went to a concert last night.  He denies any wheezing or chest pain.  He has tried Mucinex for his symptoms.  Has felt hot and cold, no documented fevers.  Denies abdominal pain, nausea or vomiting.  Reports a history of asthma in childhood that he has since grown out of.  The history is provided by the patient and medical records.  Shortness of Breath Associated symptoms: headaches and sore throat   Associated symptoms: no abdominal pain, no chest pain, no cough, no fever and no vomiting     Past Medical History:  Diagnosis Date   Asthma     Patient Active Problem List   Diagnosis Date Noted   MDD (major depressive disorder), single episode, moderate (HCC) 02/16/2020   Suicidal ideation 02/16/2020    History reviewed. No pertinent surgical history.     Home Medications    Prior to Admission medications   Medication Sig Start Date End Date Taking? Authorizing Provider  ondansetron (ZOFRAN) 4 MG tablet Take 1 tablet (4 mg total) by mouth every 6 (six) hours. 09/18/20   Wynetta Fines, MD    Family History Family History  Problem Relation Age of Onset   Healthy Mother     Social History Social History   Tobacco Use   Smoking status: Some Days    Types: Cigarettes   Smokeless tobacco: Never  Vaping Use   Vaping status: Never Used  Substance Use Topics   Alcohol use: Yes   Drug use: Yes    Frequency: 1.0 times per week    Types: Marijuana     Allergies   Patient has no known  allergies.   Review of Systems Review of Systems  Constitutional:  Positive for chills and fatigue. Negative for fever.  HENT:  Positive for congestion and sore throat.   Respiratory:  Positive for shortness of breath. Negative for cough.   Cardiovascular:  Negative for chest pain.  Gastrointestinal:  Negative for abdominal pain, diarrhea, nausea and vomiting.  Musculoskeletal:  Positive for myalgias.  Neurological:  Positive for headaches.     Physical Exam Triage Vital Signs ED Triage Vitals  Encounter Vitals Group     BP 10/10/22 1455 109/72     Systolic BP Percentile --      Diastolic BP Percentile --      Pulse Rate 10/10/22 1455 66     Resp 10/10/22 1455 18     Temp 10/10/22 1455 98 F (36.7 C)     Temp Source 10/10/22 1455 Oral     SpO2 10/10/22 1455 97 %     Weight 10/10/22 1454 130 lb (59 kg)     Height 10/10/22 1454 5\' 7"  (1.702 m)     Head Circumference --      Peak Flow --      Pain Score 10/10/22 1454 5     Pain Loc --      Pain Education --  Exclude from Growth Chart --    No data found.  Updated Vital Signs BP 109/72 (BP Location: Right Arm)   Pulse 66   Temp 98 F (36.7 C) (Oral)   Resp 18   Ht 5\' 7"  (1.702 m)   Wt 130 lb (59 kg)   SpO2 97%   BMI 20.36 kg/m   Visual Acuity Right Eye Distance:   Left Eye Distance:   Bilateral Distance:    Right Eye Near:   Left Eye Near:    Bilateral Near:     Physical Exam Vitals and nursing note reviewed.  Constitutional:      Appearance: Normal appearance. He is well-developed.  HENT:     Head: Normocephalic and atraumatic.     Right Ear: External ear normal.     Left Ear: External ear normal.     Nose: Congestion and rhinorrhea present.     Mouth/Throat:     Mouth: Mucous membranes are moist.     Pharynx: Posterior oropharyngeal erythema present.  Eyes:     Conjunctiva/sclera: Conjunctivae normal.  Cardiovascular:     Rate and Rhythm: Normal rate and regular rhythm.     Heart sounds:  Normal heart sounds. No murmur heard. Pulmonary:     Effort: Pulmonary effort is normal. No respiratory distress.     Breath sounds: Normal breath sounds.  Musculoskeletal:        General: Normal range of motion.  Skin:    General: Skin is warm and dry.  Neurological:     General: No focal deficit present.     Mental Status: He is alert and oriented to person, place, and time.  Psychiatric:        Mood and Affect: Mood normal.        Behavior: Behavior normal.      UC Treatments / Results  Labs (all labs ordered are listed, but only abnormal results are displayed) Labs Reviewed  SARS CORONAVIRUS 2 (TAT 6-24 HRS)    EKG   Radiology No results found.  Procedures Procedures (including critical care time)  Medications Ordered in UC Medications - No data to display  Initial Impression / Assessment and Plan / UC Course  I have reviewed the triage vital signs and the nursing notes.  Pertinent labs & imaging results that were available during my care of the patient were reviewed by me and considered in my medical decision making (see chart for details).  Vitals and triage reviewed, patient is hemodynamically stable. Lungs are vesicular, heart with regular rate and rhythm. PND and posterior pharynx erythema, suspect viral URI. Covid-19 swab obtained, symptomatic management discussed. POC, f/u care and return precautions given, no questions at this time. Work note provided.      Final Clinical Impressions(s) / UC Diagnoses   Final diagnoses:  Viral URI     Discharge Instructions      Your symptoms are consistent with a viral illness.  We have swabbed you for COVID-19 and our staff will contact you if positive.  Alternate between 800 mg of ibuprofen and 500 mg of Tylenol every 4-6 hours for fever, body aches and discomfort.  For sore throat you can do warm saline gargles and sleep with a humidifier.  Ensure you are staying well rested and drinking at least 64 ounces of  water daily.  Take 1200 mg of Mucinex daily to help loosen your nasal secretions.  Return to clinic for any new or urgent symptoms.  ED Prescriptions   None    PDMP not reviewed this encounter.   Reilly Molchan, [place], [place] 10/10/22 [phone]

## 2022-10-10 NOTE — Discharge Instructions (Signed)
Your symptoms are consistent with a viral illness.  We have swabbed you for COVID-19 and our staff will contact you if positive.  Alternate between 800 mg of ibuprofen and 500 mg of Tylenol every 4-6 hours for fever, body aches and discomfort.  For sore throat you can do warm saline gargles and sleep with a humidifier.  Ensure you are staying well rested and drinking at least 64 ounces of water daily.  Take 1200 mg of Mucinex daily to help loosen your nasal secretions.  Return to clinic for any new or urgent symptoms.

## 2022-10-10 NOTE — ED Triage Notes (Signed)
Patient was exposed to Covid. Now having body aches, headache, nasal congestion, sob, and fatigue. Onset of symptoms 2 days ago.

## 2022-10-11 LAB — SARS CORONAVIRUS 2 (TAT 6-24 HRS): SARS Coronavirus 2: NEGATIVE

## 2022-11-10 ENCOUNTER — Ambulatory Visit (HOSPITAL_BASED_OUTPATIENT_CLINIC_OR_DEPARTMENT_OTHER): Payer: Medicaid Other | Admitting: Student

## 2022-11-10 ENCOUNTER — Ambulatory Visit (HOSPITAL_BASED_OUTPATIENT_CLINIC_OR_DEPARTMENT_OTHER): Payer: Medicaid Other

## 2022-11-10 ENCOUNTER — Encounter (HOSPITAL_BASED_OUTPATIENT_CLINIC_OR_DEPARTMENT_OTHER): Payer: Self-pay | Admitting: Student

## 2022-11-10 ENCOUNTER — Other Ambulatory Visit (HOSPITAL_BASED_OUTPATIENT_CLINIC_OR_DEPARTMENT_OTHER): Payer: Self-pay

## 2022-11-10 DIAGNOSIS — M25511 Pain in right shoulder: Secondary | ICD-10-CM

## 2022-11-10 MED ORDER — MELOXICAM 15 MG PO TABS
15.0000 mg | ORAL_TABLET | Freq: Every day | ORAL | 0 refills | Status: AC
  Filled 2022-11-10: qty 10, 10d supply, fill #0
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
## 2022-11-19 ENCOUNTER — Ambulatory Visit (HOSPITAL_BASED_OUTPATIENT_CLINIC_OR_DEPARTMENT_OTHER): Payer: Medicaid Other | Admitting: Student

## 2023-02-07 ENCOUNTER — Other Ambulatory Visit: Payer: Self-pay

## 2023-04-03 ENCOUNTER — Emergency Department (HOSPITAL_COMMUNITY): Admission: EM | Admit: 2023-04-03 | Discharge: 2023-04-04 | Disposition: A | Discharge status: Home / Self Care

## 2023-04-03 DIAGNOSIS — R059 Cough, unspecified: Secondary | ICD-10-CM | POA: Diagnosis present

## 2023-04-03 DIAGNOSIS — J101 Influenza due to other identified influenza virus with other respiratory manifestations: Secondary | ICD-10-CM | POA: Diagnosis not present

## 2023-04-03 NOTE — ED Triage Notes (Signed)
 X 5/6pm tonight, pt denies sick contacts pt temp in triage 101.1 , has taken no meds PTA

## 2023-04-04 LAB — RESP PANEL BY RT-PCR (RSV, FLU A&B, COVID)  RVPGX2
Influenza A by PCR: POSITIVE — AB
Influenza B by PCR: NEGATIVE
Resp Syncytial Virus by PCR: NEGATIVE
SARS Coronavirus 2 by RT PCR: NEGATIVE

## 2023-04-04 LAB — GROUP A STREP BY PCR: Group A Strep by PCR: NOT DETECTED

## 2023-04-04 MED ORDER — ALBUTEROL SULFATE HFA 108 (90 BASE) MCG/ACT IN AERS: 1.0000 | INHALATION_SPRAY | Freq: Four times a day (QID) | RESPIRATORY_TRACT | 0 refills | Status: AC | PRN

## 2023-04-04 MED ORDER — NAPROXEN 500 MG PO TABS: 500.0000 mg | ORAL_TABLET | Freq: Two times a day (BID) | ORAL | 0 refills | Status: AC

## 2023-04-04 MED ORDER — ACETAMINOPHEN 325 MG PO TABS
650.0000 mg | ORAL_TABLET | Freq: Once | ORAL | Status: AC | PRN
  Administered 2023-04-04: 650 mg via ORAL
  Filled 2023-04-04: qty 2

## 2023-04-04 MED ORDER — PSEUDOEPH-BROMPHEN-DM 30-2-10 MG/5ML PO SYRP: 5.0000 mL | ORAL_SOLUTION | Freq: Four times a day (QID) | ORAL | 0 refills | Status: AC | PRN

## 2023-04-04 NOTE — ED Provider Notes (Signed)
 [place] [facility] AT [facility] Provider Note   CSN: [id number] Arrival date & time: 04/03/23  2342     History  Chief Complaint  Patient presents with   Chills    Noah Hanson is a 28 y.o. male.  28 year old male with no reported past medical history presenting to the emergency department today with cough and myalgias.  The patient states that he has been having cough and myalgias as well as fatigue now since Thursday.  He denies any chest pain with this.  He states that he was still feeling unwell today so he came to the ER for further evaluation.  He denies being around any known sick contacts.  He has been having subjective fevers and chills at home.  Denies any neck pain or stiffness.        Home Medications Prior to Admission medications   Medication Sig Start Date End Date Taking? Authorizing Provider  albuterol (VENTOLIN HFA) 108 (90 Base) MCG/ACT inhaler Inhale 1-2 puffs into the lungs every 6 (six) hours as needed for wheezing or shortness of breath. 04/04/23  Yes Durwin Glaze, MD  brompheniramine-pseudoephedrine-DM 30-2-10 MG/5ML syrup Take 5 mLs by mouth 4 (four) times daily as needed (Cough). 04/04/23  Yes Durwin Glaze, MD  naproxen (NAPROSYN) 500 MG tablet Take 1 tablet (500 mg total) by mouth 2 (two) times daily. 04/04/23  Yes Durwin Glaze, MD  ondansetron (ZOFRAN) 4 MG tablet Take 1 tablet (4 mg total) by mouth every 6 (six) hours. 09/18/20   Wynetta Fines, MD      Allergies    Patient has no known allergies.    Review of Systems   Review of Systems  Constitutional:  Positive for fever.  HENT:  Positive for congestion.   Respiratory:  Positive for cough.   All other systems reviewed and are negative.   Physical Exam Updated Vital Signs BP 127/82   Pulse (!) 110   Temp 99.2 F (37.3 C) (Oral)   Resp 18   SpO2 99%  Physical Exam Vitals and nursing note reviewed.   Gen: NAD Eyes: PERRL, EOMI HEENT: no oropharyngeal  swelling Neck: trachea midline, no meningismus Resp: clear to auscultation bilaterally Card: HR in low 100s, no murmurs, rubs, or gallops Abd: nontender, nondistended Extremities: no calf tenderness, no edema Vascular: 2+ radial pulses bilaterally, 2+ DP pulses bilaterally Neuro: No focal deficits Skin: no rashes Psyc: acting appropriately   ED Results / Procedures / Treatments   Labs (all labs ordered are listed, but only abnormal results are displayed) Labs Reviewed  RESP PANEL BY RT-PCR (RSV, FLU A&B, COVID)  RVPGX2 - Abnormal; Notable for the following components:      Result Value   Influenza A by PCR POSITIVE (*)    All other components within normal limits  GROUP A STREP BY PCR    EKG None  Radiology No results found.  Procedures Procedures    Medications Ordered in ED Medications  acetaminophen (TYLENOL) tablet 650 mg (650 mg Oral Given 04/04/23 0006)    ED Course/ Medical Decision Making/ A&P                                 Medical Decision Making 28 year old male with no reported past medical history presents emergency department today with URI symptoms.  The patient's flu test is positive which certainly does explain his symptoms.  He  is otherwise well-appearing.  He is mildly tachycardic here on reassessment with stable vital signs but normal heart rate arrival.  I think that he is stable for discharge.  He will be discharged with return precautions.  Risk OTC drugs.           Final Clinical Impression(s) / ED Diagnoses Final diagnoses:  Influenza A    Rx / DC Orders ED Discharge Orders          Ordered    naproxen (NAPROSYN) 500 MG tablet  2 times daily        04/04/23 0218    albuterol (VENTOLIN HFA) 108 (90 Base) MCG/ACT inhaler  Every 6 hours PRN        04/04/23 0218    brompheniramine-pseudoephedrine-DM 30-2-10 MG/5ML syrup  4 times daily PRN        04/04/23 0218              Durwin Glaze, MD 04/04/23 [phone]

## 2023-04-04 NOTE — Discharge Instructions (Signed)
 Your flu test today was positive.  Please take the medications as prescribed and follow-up with your doctor.  Return to the ER for worsening symptoms.

## 2024-02-29 DIAGNOSIS — R5383 Other fatigue: Secondary | ICD-10-CM | POA: Insufficient documentation

## 2024-02-29 DIAGNOSIS — R110 Nausea: Secondary | ICD-10-CM | POA: Insufficient documentation

## 2024-02-29 DIAGNOSIS — R6883 Chills (without fever): Secondary | ICD-10-CM | POA: Insufficient documentation

## 2024-03-01 ENCOUNTER — Other Ambulatory Visit: Payer: Self-pay

## 2024-03-01 ENCOUNTER — Emergency Department (HOSPITAL_COMMUNITY)
Admission: EM | Admit: 2024-03-01 | Discharge: 2024-03-01 | Disposition: A | Attending: Emergency Medicine | Admitting: Emergency Medicine

## 2024-03-01 ENCOUNTER — Encounter (HOSPITAL_COMMUNITY): Payer: Self-pay | Admitting: Emergency Medicine

## 2024-03-01 DIAGNOSIS — R110 Nausea: Secondary | ICD-10-CM

## 2024-03-01 LAB — RESP PANEL BY RT-PCR (RSV, FLU A&B, COVID)  RVPGX2
Influenza A by PCR: NEGATIVE
Influenza B by PCR: NEGATIVE
Resp Syncytial Virus by PCR: NEGATIVE
SARS Coronavirus 2 by RT PCR: NEGATIVE

## 2024-03-01 MED ORDER — ONDANSETRON 4 MG PO TBDP: 4.0000 mg | ORAL_TABLET | Freq: Three times a day (TID) | ORAL | 0 refills | Status: AC | PRN

## 2024-03-01 NOTE — ED Triage Notes (Signed)
 Pt c/o fatigue, nausea, and chills x a few hours while at work. States that he felt better when he went home.

## 2024-03-01 NOTE — Discharge Instructions (Addendum)
 Take the prescribed medication as directed. Drink fluids, bland diet for now if nausea continues.  Can advance as tolerated. Return to the ED for new or worsening symptoms.

## 2024-03-01 NOTE — ED Notes (Signed)
 Patient axox4. GCS 15. PT verbalizes understanding of discharge instructions, follow up and new Rx. Pt ambulated out of er with steady gait to transportation home by self
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
